# Patient Record
Sex: Female | Born: 1970 | Race: Black or African American | Hispanic: No | Marital: Married | Smoking: Never smoker
Health system: Southern US, Community
[De-identification: ages and names within clinical notes are randomized; demographics above are authoritative.]

## PROBLEM LIST (undated history)

## (undated) DIAGNOSIS — R112 Nausea with vomiting, unspecified: Secondary | ICD-10-CM

## (undated) DIAGNOSIS — I10 Essential (primary) hypertension: Secondary | ICD-10-CM

## (undated) DIAGNOSIS — R42 Dizziness and giddiness: Secondary | ICD-10-CM

## (undated) HISTORY — PX: CARPAL TUNNEL RELEASE: SHX101

## (undated) HISTORY — DX: Essential (primary) hypertension: I10

## (undated) HISTORY — PX: ABDOMINAL HYSTERECTOMY: SHX81

---

## 2003-08-28 ENCOUNTER — Ambulatory Visit (HOSPITAL_COMMUNITY): Admission: RE | Admit: 2003-08-28 | Discharge: 2003-08-28 | Payer: Self-pay | Admitting: Obstetrics & Gynecology

## 2003-09-27 ENCOUNTER — Ambulatory Visit (HOSPITAL_COMMUNITY): Admission: RE | Admit: 2003-09-27 | Discharge: 2003-09-27 | Payer: Self-pay | Admitting: *Deleted

## 2004-02-08 ENCOUNTER — Observation Stay (HOSPITAL_COMMUNITY): Admission: AD | Admit: 2004-02-08 | Discharge: 2004-02-08 | Payer: Self-pay | Admitting: Obstetrics & Gynecology

## 2004-03-19 ENCOUNTER — Inpatient Hospital Stay (HOSPITAL_COMMUNITY): Admission: RE | Admit: 2004-03-19 | Discharge: 2004-03-21 | Payer: Self-pay | Admitting: Obstetrics and Gynecology

## 2004-08-10 ENCOUNTER — Inpatient Hospital Stay (HOSPITAL_COMMUNITY): Admission: AD | Admit: 2004-08-10 | Discharge: 2004-08-12 | Payer: Self-pay | Admitting: Obstetrics and Gynecology

## 2004-08-13 ENCOUNTER — Inpatient Hospital Stay (HOSPITAL_COMMUNITY): Admission: EM | Admit: 2004-08-13 | Discharge: 2004-08-15 | Payer: Self-pay | Admitting: *Deleted

## 2004-09-17 ENCOUNTER — Inpatient Hospital Stay (HOSPITAL_COMMUNITY): Admission: RE | Admit: 2004-09-17 | Discharge: 2004-09-19 | Payer: Self-pay | Admitting: Obstetrics and Gynecology

## 2004-09-20 ENCOUNTER — Emergency Department (HOSPITAL_COMMUNITY): Admission: EM | Admit: 2004-09-20 | Discharge: 2004-09-20 | Payer: Self-pay | Admitting: Emergency Medicine

## 2004-09-20 ENCOUNTER — Inpatient Hospital Stay (HOSPITAL_COMMUNITY): Admission: RE | Admit: 2004-09-20 | Discharge: 2004-09-22 | Payer: Self-pay | Admitting: *Deleted

## 2005-04-18 ENCOUNTER — Emergency Department (HOSPITAL_COMMUNITY): Admission: EM | Admit: 2005-04-18 | Discharge: 2005-04-18 | Payer: Self-pay | Admitting: Emergency Medicine

## 2007-11-21 ENCOUNTER — Ambulatory Visit (HOSPITAL_COMMUNITY): Admission: RE | Admit: 2007-11-21 | Discharge: 2007-11-21 | Payer: Self-pay | Admitting: Family Medicine

## 2007-11-21 ENCOUNTER — Encounter (INDEPENDENT_AMBULATORY_CARE_PROVIDER_SITE_OTHER): Payer: Self-pay | Admitting: Family Medicine

## 2010-11-27 NOTE — H&P (Signed)
[name], [name] NO.:  [id number]   MEDICAL RECORD NO.:  [id number]          PATIENT TYPE:  INP   LOCATION:  A428                          FACILITY:  [facility]   PHYSICIAN:  Tilda Burrow, M.D. DATE OF BIRTH:  Aug 15, 1970   DATE OF ADMISSION:  08/10/2004  DATE OF DISCHARGE:  LH                                HISTORY & PHYSICAL   ADMISSION DIAGNOSES:  1.  Menorrhagia, recurrent.  2.  Severe anemia.  3.  Desire for elective permanent sterilization.  4.  Holosystolic 3/6 flow murmur felt secondary to anemia.   HISTORY OF PRESENT ILLNESS:  This 40 year old female gravida 4, para 2, AB-2  is now 3-1/2 months since primary cesarean section for her second child, is  admitted at this time for transfusions and hysteroscopy, D&C, endometrial  ablation accompanied by elective permanent sterilization.  [name] has been  seen in our office and evaluated for severe anemia. She had a hemoglobin of  6.3 and hematocrit 19.5 in the office on August 10, 2004.  She had been  brought to admission for transfusion as the bleeding still continues  vigorously.  She has been placed on IV Premarin 25 mg IV q.6h over night.  She is still flowing heavily. Plans are for proceeding with hysteroscopy,  D&C and endometrial ablation today as well as tubal ligation.  Her recent  pregnancy was notable for a hemoglobin of 12, hematocrit 36 during the  pregnancy, dropping only slightly to 10.5 and 31.4. A review of her old  records from prior obstetricians indicates that she had episodes of  dysfunction bleeding with menorrhagia with documented hemoglobins as low as  8.9 in 1999, requiring treatment with progesterone agents as well as t.i.d.  birth control pills with 50 mcg estrogen dosages to treat the problem then.  Evaluation this time has included a pelvic ultrasound in our office which  shows an essentially normal sized uterus, 10.9 cm in length, 5.5 cm AP  diameter, with a thickened  endometrial stripe even without any visible clots  in the fundal portion when the ultrasound was performed on August 05, 2004.  She has nonetheless been bleeding heavily at the time of that visit.  She  was not bleeding as profusely as now.  Hemoglobins have been documented in  the office at 10.8 on July 22, 2004, dropping to 6.5 shortly afterwards.  The possibility that this represents a bleeding abnormality of the old C-  section scar will be explored as we do the hysteroscopy.  Coagulation  studies show normal PT and PTT.   PAST MEDICAL HISTORY:  Positive for history of atrial fibrillation resulting  in 2-D echocardiogram performed by Dr. Pricilla Riffle at [facility]  on September 27, 2003.  This showed normal left ventricle with normal posterior  wall. The aortic valve was normal, mitral valve was normal, pulmonic valve  normal and tricuspid valve normal.  Ejection fraction was 60 to 65% of the  left ventricle. Right ventricular function was considered normal with no  effusion.  Holter monitor showed only a single episode of 3-beat-run  of wide  complex tachycardia but no actual fibrillation as her history had been  relayed to [name] (palpitations).   SURGICAL HISTORY:  Primary cesarean section in 2005 due to history of severe  shoulder dystocia with her first infant.   GYN HISTORY:  Notable for the previously mentioned recurrent episodes of  dysfunction bleeding requiring hormone manipulations with anemia as low as  8.9 in the past.   PHYSICAL EXAMINATION:  GENERAL:  Exam reveals an obviously pale African-  American female in no acute distress, tolerating the anemia surprisingly  well.  Alert and oriented x3.  NECK:  Supple.  CHEST:  Clear to auscultation.  Pulse in the 90's with 3/6 holosystolic flow  murmur over the entire precordium and also heard at cardiac apex.  ABDOMEN:  Nontender.  PELVIC:  Normal female external genitalia. Vaginal exam shows the vault full  of  generous watery blood with accompanying clots. Uterus is nontender,  anteflexed, non purulent flow. The uterus is 8-week size on bimanual exam.  Adnexae nontender without masses.   ASSESSMENT:  Menorrhagia with subsequent severe anemia and flow murmur of  anemia. Desire for elective permanent sterilization.   PLAN:  Hysteroscopy, dilation and curettage, endometrial ablation with  accompanying tubal ligation (F[place]ope-rings) on August 11, 2004).      JVF/MEDQ  D:  08/11/2004  T:  08/11/2004  Job:  [id number]

## 2010-11-27 NOTE — H&P (Signed)
NAMEGWENITH, [name]              ACCOUNT NO.:  [id number]   MEDICAL RECORD NO.:  [id number]          PATIENT TYPE:  AMB   LOCATION:  DAY                           FACILITY:  [facility]   PHYSICIAN:  Tilda Burrow, M.D. DATE OF BIRTH:  [birth year]   DATE OF ADMISSION:  09/17/2004  DATE OF DISCHARGE:  LH                                HISTORY & PHYSICAL   ADMITTING DIAGNOSES:  1.  Menorrhagia, failed to respond to endometrial ablation.  2.  Uterine fibroids, 12 weeks size.  3.  Anemia.   HISTORY OF PRESENT ILLNESS:  This 40 year old female recently status post a  hysteroscopy, D&C, endometrial ablation, August 10, 2004, has had a  unsatisfactory postoperative course.  She had a prolonged slow healing from  the endometrial ablation with dilated and the cervical canal as late as a  month after the procedure.  Hemoglobin did improve to 10.2 but unfortunately  now she has started to return to bleeding just like before.  She has been  bleeding since September 14, 2004 and is bleeding heavily.  When examined she has  perfuse fluid bloody menses without clots.  This is a distinctly abnormal  volume of blood.  Hemoglobin has dropped from 10.2 to 9.5 already.  After  discussion of options, the patient is desiring to proceed with a  hysterectomy.   PAST MEDICAL HISTORY:  History of atrial fibrillation with a 2D  echocardiogram reported normal.  Aortic valve was normal.  Ejection fraction  60-65%.  See details in old record.   SURGICAL HISTORY:  Primary cesarean section, 2005, after a vaginal delivery  complicated by severe shoulder dystocia with her first infant.   GYNECOLOGIC HISTORY:  Recurrent episodes of dysfunction bleeding requiring  hormone manipulation with hemoglobin as low as 8.9 in the past.   PHYSICAL EXAMINATION:  GENERAL:  Shows a healthy-appearing African American  female, alert, oriented x 3.  HEENT:  Pupils are equal, round and reactive.  Extraocular movements intact.  NECK:   Supple.  Trachea midline.  CHEST:  Clear to auscultation.  ABDOMEN:  Well healed Pfannenstiel incision.  EXTERNAL GENITALIA:  Normal.  PELVIC:  Vaginal exam normal secretions.  Cervix multiparous.  Heavy blood  at introitus and at cervix.  The uterus anterior, 12 weeks size.  Adnexa  nontender without appreciable masses.   IMPRESSION:  1.  Menorrhagia.  2.  Anemia.  3.  Uterine fibroids.  4.  Failed endometrial ablation.   PLAN:  Abdominal hysterectomy with removal of cervix through a Pfannenstiel  incision, September 17, 2004.      JVF/MEDQ  D:  09/16/2004  T:  09/16/2004  Job:  [id number]   cc:   Tilda Burrow, M.D.  [address] [place]  [place] [address]  Fax: [phone]

## 2010-11-27 NOTE — Op Note (Signed)
NAMEDEBERAH, ADOLF              ACCOUNT NO.:  [id number]   MEDICAL RECORD NO.:  [id number]          PATIENT TYPE:  AMB   LOCATION:  DAY                           FACILITY:  [facility]   PHYSICIAN:  Tilda Burrow, M.D. DATE OF BIRTH:  [birth year]   DATE OF PROCEDURE:  09/17/2004  DATE OF DISCHARGE:                                 OPERATIVE REPORT   PREOPERATIVE DIAGNOSES:  1.  Menorrhagia failing to respond to endometrial ablation.  2.  Uterine fibroids, 12-week size.  3.  Anemia.   POSTOPERATIVE DIAGNOSES:  1.  Menorrhagia failing to respond to endometrial ablation.  2.  Uterine fibroids, 12-week size.  3.  Anemia.   PROCEDURES:  1.  Total abdominal hysterectomy.  2.  Wide excision of cicatrix.   SURGEON:  Tilda Burrow, M.D.   ASSISTANTAsencion Noble, C.S.T.-F.A.   ANESTHESIA:  General.   COMPLICATIONS:  None.   FINDINGS:  Thinned-out lower uterine segment at site of old C-section scar.  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  Normal tubes  and ovaries bilaterally.  Evidence of old prior tubal ligation.   DETAILS OF PROCEDURE:  The patient was taken to the operating room and  prepped and draped in the usual fashion for low abdominal surgery.  A  Pfannenstiel-type incision was repeated with a 25 cm long ellipse of skin by  10 cm wide by 4 cm deep removed and then the fascia opened transversely.  The 2 cm portion of the fascia was trimmed as well to result in a  satisfactory closure result.  We then proceeded with opening the abdominal  cavity carefully with no suspicion of injury to bowel or internal organs.  Some omental adhesions to the anterior abdominal wall were identified and  taken down.  We then proceeded to identify the uterus and pack the bowel  away using three moistened laparotomy tapes, and a Balfour retractor was  placed in position.  Bladder flap in place.  The uterus was grasped with a  Lahey thyroid tenaculum, the round ligaments  taken down on either side using  0 chromic and Bovie cautery and then the utero-ovarian ligament incised,  isolated, clamped, cut, and suture ligated on either side.  The bladder flap  was developed anteriorly with some difficulty due to scarring and fibrosis.  The upper portion was developed sufficiently that the broad ligament could  be skeletonized down to the uterine vessels, curved Heaney clamp used to  crossclamp the uterine vessels, which were then ligated with 0 chromic after  transection.  The bladder flap was then carefully taken down further.  The  bladder was densely adherent to the old C-section scar and we entered from  either side and I was able to peel the bladder with its thickened,  erythematous tissue from the lower uterine segment, which was thin enough  that it actually separated into where we could see into the endocervical  canal (or lower uterine segment) through a window in the old C-section scar  site.  The bladder flap was not able to be mobilized well  at this point, and  we proceeded with taking down the upper and lower cardinal ligaments with  curved Heaney clamps and transection of uterine vessels and 0 chromic suture  ligature, followed by straight Heaney clamps across the lower cardinal  ligaments, a nice transection of the tissue and 0 chromic suture ligature.  A stab incision in the anterior cervicovaginal fornix was performed and the  cervix amputated off the vaginal cuff.  Aldridge stitches were placed at  each lateral vaginal angle and a posterior suture placed to even up the  length of the vaginal cuff and then the vaginal cuff was closed side-to-side  using a combination of interrupted and running 0 chromic sutures.  Hemostasis was quite good.  Irrigation in the pelvis was performed and  reperitonealization using interrupted 2-0 chromic sutures as allowed by the  natural inclination of the tissues.  The bladder flap was not attached down  to the  vaginal cuff, as we would prefer not to recreate the bladder  fibrosis.  The pelvis was irrigated, hemostasis confirmed, and laparotomy  equipment removed.  The anterior peritoneum was closed using continuous  running 2-0_chromic_.  The fascia was easily reapproximated with continuous  running 0 Vicryl.  The subcu fatty tissues were freed sufficiently to allow  for good tissue edge approximation, and interrupted 2-0 plain suture was  used in the subcu tissues after irrigation, resulting in good skin edge  approximation and staple closure of the skin completing the procedure.  A  flat Jackson-Pratt drain was placed just above the fascia and allowed to  exit through the incision at the right corner of the incision.  This was  sewn in with nylon suture.  The patient went to the recovery room in  excellent condition.  A 200 mL blood loss.      JVF/MEDQ  D:  09/17/2004  T:  09/17/2004  Job:  [id number]

## 2010-11-27 NOTE — Discharge Summary (Signed)
NAMEMARQUE, BANGO              ACCOUNT NO.:  [id number]   MEDICAL RECORD NO.:  [id number]          PATIENT TYPE:  INP   LOCATION:  A318                          FACILITY:  [facility]   PHYSICIAN:  Edward L. Juanetta Gosling, M.D.DATE OF BIRTH:  1970/10/26   DATE OF ADMISSION:  08/13/2004  DATE OF DISCHARGE:  02/04/2006LH                                 DISCHARGE SUMMARY   FINAL DISCHARGE DIAGNOSES:  1.  Pneumonia.  2.  Status post endometrial ablation.  3.  Anemia related to menorrhagia.  4.  History of atrial fibrillation.  5.  Hypokalemia.   HISTORY OF PRESENT ILLNESS:  [name] is a 40 year old who had an  endometrial ablation about two days prior to her admission.  She said that  she had felt a little short of breath prior to discharge but Dr. Emelda Dyer  saw her, evaluated her, she seemed to be doing okay and she was discharged  home.  She got worse through the night.  She was coughing and was congested.  She came to the emergency room.  She appeared to be in some acute  respiratory distress.  She underwent a CT scan because of concerns about  pulmonary embolism, which actually showed pneumonia, not pulmonary embolism.   PHYSICAL EXAMINATION:  GENERAL:  Showed that she was coughing constantly  during the exam nonproductively.  VITAL SIGNS:  Temperature 97.4, heart rate was 80.  Blood pressure 153/72.  Respirations 28.  She had a systolic heart murmur.  CHEST:  Showed showed rhonchi bilaterally.  Rales in the left base.   LABORATORY DATA:  A CBC showed a white count of 15,100, hemoglobin was 9.7.  B-MET showed a potassium of 3.3.  A blood gas on room air showed a pH of  7.47, PCO2 of 33, PO2 of 48.   ASSESSMENT:  She had pneumonia.   HOSPITAL COURSE:  She was started on intravenous antibiotics, given IV  fluids, and improved.  By the time of discharge her O2 saturation was 95% on  room air.  She was afebrile.   DISCHARGE MEDICATIONS:  1.  Ceftin 500 mg b.i.d.  2.  Codiclear DH  5 cc q.4h. p.r.n. cough.   FOLLOW UP:  In my office and with Dr. Emelda Dyer.  She may need to be treated  for hypertension.  There is a very strong family history of hypertension as  well but I would like to see what she does on an outpatient basis.      ELH/MEDQ  D:  08/15/2004  T:  08/15/2004  Job:  [id number]

## 2010-11-27 NOTE — Discharge Summary (Signed)
NAME:  [name], [name]                        ACCOUNT NO.:  [id number]   MEDICAL RECORD NO.:  [id number]                   PATIENT TYPE:  INP   LOCATION:  A413                                 FACILITY:  [facility]   PHYSICIAN:  Tilda Burrow, M.D.              DATE OF BIRTH:  1971-07-04   DATE OF ADMISSION:  03/19/2004  DATE OF DISCHARGE:  03/21/2004                                 DISCHARGE SUMMARY   ADMISSION DIAGNOSES:  1.  Pregnancy, [redacted] weeks gestation.  2.  History of severe shoulder dystocia.  3.  Requesting elective primary Cesarean section.   DISCHARGE DIAGNOSIS:  1.  Pregnancy, 39 weeks, delivered.  2.  Elective primary Cesarean section.   PROCEDURE:  Elective primary low transverse Cesarean section by Tilda Burrow, M.D.   DISCHARGE MEDICATIONS:  1.  Tylox 1 to 2 q.4h. p.r.n. pain.  2.  Motrin 800 mg 1 q.8h. for mild pain and cramps.   FOLLOW UP:  Follow up in 4 days.   HISTORY OF PRESENT ILLNESS:  This is a 40 year old female, gravida 4, para  1, AB 2 with 41-cm final height was admitted for primary Cesarean section  after treatment options discussed. Prior obstetric history was notable for  severe shoulder dystocia with a 7-pound, 13-ounce infant. The infant did  well but had Apgars 1, 5, and 7 initially. The patient does not desire  permanent sterilization.   HOSPITAL COURSE:  The patient underwent elective primary Cesarean section  without difficulty, delivering an 8-pound, 10-ounce female infant with a  vertex well out of the pelvis. The postoperative course was straightforward,  hemoglobin 9.1, hematocrit 26.5, white count 12,600. She is afebrile,  tolerating a regular diet, stable for discharge March 21, 2004 for  follow up in 1 week for staple removal and 4 weeks for routine check. The  patient is delighted with the outcome.     ___________________________________________                                         Tilda Burrow, M.D.   JVF/MEDQ   D:  03/26/2004  T:  03/26/2004  Job:  [id number]

## 2010-11-27 NOTE — Discharge Summary (Signed)
NAMEKAMMI, HECHLER NO.:  [id number]   MEDICAL RECORD NO.:  [id number]          PATIENT TYPE:  INP   LOCATION:  A419                          FACILITY:  [facility]   PHYSICIAN:  Tilda Burrow, M.D. DATE OF BIRTH:  1971-02-18   DATE OF ADMISSION:  09/17/2004  DATE OF DISCHARGE:  03/11/2006LH                                 DISCHARGE SUMMARY   ADMITTING DIAGNOSIS:  Menorrhagia, failed to respond to endometrial  ablation, uterine fibroids, anemia.   DISCHARGE DIAGNOSES:  1.  Menorrhagia.  2.  Uterine fibroids, 12 week size.  3.  Anemia.  4.  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   PROCEDURE:  September 17, 2004, total abdominal hysterectomy, wide excision of  cicatrix.   DETAILS OF HOSPITALIZATION:  This 40 year old female status post  hysteroscopy, D&C and endometrial ablation August 10, 2004 with prolonged  slow healing due to the endometrial ablation and dilation of the  endocervical canal as late as a month after the procedure.  Hemoglobin had  improved to 10.2, but returned to bleeding just like before when she had  required transfusions.  Hemoglobin dropped from 10.2 to 9.5 on her last  bleeding episode.   PAST MEDICAL HISTORY:  Positive for history of atrial fibrillation with  normal echocardiogram.   PAST SURGICAL HISTORY:  Cesarean section 2005 after vaginal delivery  complicated by severe shoulder dystocia, first infant.   GYN HISTORY:  Notable for multiple dysfunctional bleeding episodes with  hemoglobin as low as 8.9 in the past.  The patient had a 12-week size  uterus.  There was an old scar from her C-section that the patient requested  cosmetic improvement.   HOSPITAL COURSE:  The patient was taken to the operating room on September 17, 2004 for day surgery.  Undergoing abdominal hysterectomy as described in the  operative note.  Hysterectomy was notable for a very thinned out lower  uterine segment inside the old C-section  scar.  It was of interest that the  lower uterine segment defect opened up as we extracted the fibrotic bladder  attachments to the old C-section scar.  Pathology report showed a 150 g  uterus already opened.  Pathology report showed adenomyosis extending beyond  the area of the endometrial ablation previously performed.   Postoperative day #1 was notable for hemoglobin 9.1, hematocrit 27, down  from 10.0, 29.8 perioperatively.  She had a low-grade fever to 101 in the  morning of POD #1.  Question was atelectasis versus delayed bowel function.  She had __________ x1 on day #2.  She had passage of gas.  Hemoglobin  dropped to 8.9, 26.4 on postoperative day #2.  She was considered stable for discharge and sent home at that time by Dr.  Lisette Grinder after she was able to take lunch without throwing up.   Discharge instructions given as per routine.      JVF/MEDQ  D:  09/30/2004  T:  10/01/2004  Job:  [id number]   cc:   Lisette Grinder, MD

## 2010-11-27 NOTE — Procedures (Signed)
NAME:  ALSHA, MELAND                        ACCOUNT NO.:  [id number]   MEDICAL RECORD NO.:  [id number]                   PATIENT TYPE:  OUT   LOCATION:  RAD                                  FACILITY:  [facility]   PHYSICIAN:  Pricilla Riffle, M.D.                 DATE OF BIRTH:  06/27/1971   DATE OF PROCEDURE:  DATE OF DISCHARGE:                                  ECHOCARDIOGRAM   INDICATION:  A 40 year old with a history of atrial fibrillation.   PROCEDURE:  A 2-D echo with echo Doppler.   FINDINGS:  1. The left ventricle is normal in size with an end-diastolic dimension of     43 mm.  Interventricular septum is normal at 8 mm.  The posterior wall is     normal at 10 mm.  2. The left atrium and right atrium are normal in size.  The left atrium     measures 34 mm.  The right ventricle is normal in size.  3. The aortic valve is normal with no insufficiency.  The mitral valve is     normal with no insufficiency.  The pulmonic valve is normal with no     insufficiency.  The tricuspid valve is normal with no insufficiency.  4. Overall LV systolic junction is normal with an LVEF of approximately 60-     65%.  RV systolic function is normal.  5. No pericardial effusion is seen.      ___________________________________________                                            Pricilla Riffle, M.D.   PVR/MEDQ  D:  09/27/2003  T:  09/28/2003  Job:  [id number]   cc:   Lazaro Arms, M.D.  [address]  [place]  [place] [address]  Fax: [phone]

## 2010-11-27 NOTE — Discharge Summary (Signed)
[name], [name]              ACCOUNT NO.:  [id number]   MEDICAL RECORD NO.:  [id number]          PATIENT TYPE:  INP   LOCATION:  A428                          [facility]ACILITY:  [facility]   PHYSICIAN:  Tilda Burrow, M.D. DATE OF BIRTH:  [birth year]   DATE OF ADMISSION:  08/10/2004  DATE OF DISCHARGE:  02/01/2006LH                                 DISCHARGE SUMMARY   ADMISSION DIAGNOSES:  1.  Menorrhagia recurrent.  2.  Severe anemia.  3.  Desire for elective permanent sterilization.  4.  Holosystolic 3/6 flow murmur secondary to anemia.   DISCHARGE DIAGNOSES:  1.  Menorrhagia recurrent.  2.  Severe anemia corrected.  3.  Desire for elective permanent sterilization.   PROCEDURES:  1.  Transfusion x4 units packed cells on August 10, 2004 and August 11, 2004.  2.  Procedure on August 11, 2004 of hysterectomy, D&C, endometrial      ablation, and laparoscopic tubal sterilization with fallopian rings.   DISCHARGE MEDICATIONS:  1.  Chromagen Forte b.i.d. x30 days.  2.  Darvocet-N 100 #30 two p.o. q.4h. mild pain.  3.  Doxycycline 100 mg twice daily x5 days.   HOSPITAL SUMMARY:  This is a 40 year old female with 3-1/3 months since a  cesarean delivery of her second child, who was admitted for a hysteroscopy,  D&C, and an endometrial ablation after preoperative evaluation included a  severe hemoglobin drop to 6.3.  She was admitted for transfusions and  stabilized.  See HPI for details.   HOSPITAL COURSE:  The patient was admitted.  Her hemoglobin was reconfirmed  at 6.0.  She had continued to bleed since being seen in the office.  She had  a transfusion of three units of packed cells with the hemoglobin increasing  to 9.1 and hematocrit 26.2.  A fourth unit was given and she was taken for a  hysteroscopy, D&C, endometrial ablation, and a tubal ligation as described  in the operative note.  Postoperatively she remained very  sedated from the anesthesia until approximately 11  p.m. but was  cardiovascularly stable.  Her postoperative hemoglobin was 9.9, hematocrit  28.3, white count 13,400 with 81 neutrophils on the day of discharge.  She  was considered stable for discharge on the morning of 08-12-2004.      JVF/MEDQ  D:  08/12/2004  T:  08/12/2004  Job:  [id number]

## 2010-11-27 NOTE — Op Note (Signed)
NAME:  [name], CAIRNS                        ACCOUNT NO.:  [id number]   MEDICAL RECORD NO.:  [id number]                   PATIENT TYPE:  INP   LOCATION:  A425                                 FACILITY:  [facility]   PHYSICIAN:  Tilda Burrow, M.D.              DATE OF BIRTH:  [birth year]   DATE OF PROCEDURE:  DATE OF DISCHARGE:                                 OPERATIVE REPORT   PREOPERATIVE DIAGNOSES:  1.  Pregnancy at 39 weeks.  2.  History of prior infant with severe shoulder dystocia, larger infant.  3.  Requested elective primary Cesarean section.   POSTOPERATIVE DIAGNOSES:  1.  Pregnancy at 39 weeks.  2.  History of prior infant with severe shoulder dystocia, larger infant.  3.  Requested elective primary Cesarean section.   PROCEDURE:  Primary low transverse cervical Cesarean section.   SURGEON:  Tilda Burrow, M.D.   ASSISTANTAsencion Noble, C.S.T.-F.A.   ANESTHESIA:  Spinal by Garner Nash, C.R.N.A.   COMPLICATIONS:  Higher spinal anesthesia than average, not requiring any  additional interventions.   FINDINGS:  8 pound 10 ounce female infant, Apgars 9 and 9, presenting part out  of pelvis.   DESCRIPTION OF PROCEDURE:  The patient was taken to the operating room.  Spinal anesthesia was introduced, and the abdomen was prepped and draped.  The spinal went higher than expected, and the patient was able to breath  spontaneously but had motor and sensory loss to higher than usual level.   A Pfannenstiel-type incision was performed without difficulty.  The bladder  flap was developed from the lower uterine segment, and a transverse uterine  incision was made with the knife, identifying clear amniotic fluid, with the  incision extended laterally using index finger traction and then the fetal  vertex rotated into the incision manually and delivered using fundal  pressure by the first assistant and the vertex guided by the surgeon.  The  delivery was without complications and  delivered with cord clamped and cut  and the infant passed to the care of Dr. Francoise Schaumann. Halm the pediatrician in  attendance.  Apgars of 9 and 9 were assigned.  See Dr. Webb Laws notes for  further details on the infant.   The placenta was delivered after cord blood samples were obtained.  The  placenta appeared grossly normal without meconium discoloration and was  discarded.  Uterine irrigation was minimally done, and then the uterus was  closed with a single layer running locking closure with 0 chromic.  IV  antibiotics were administered.  The bladder flap was loosely reapproximated  with continuous running 2-0 chromic.  The anterior abdominal cavity was  irrigated with antibiotic solution.  The anterior peritoneum was closed with  2-0 chromic, and the fascia was closed with continuous running 0 Vicryl.  The subcutaneous tissues were reapproximated using 2-0 plain, and staple  closure of the skin completed the procedure.  The patient tolerated the procedure well and went to the recovery room in  good condition.      ___________________________________________                                            Tilda Burrow, M.D.   JVF/MEDQ  D:  03/19/2004  T:  03/19/2004  Job:  [id number]   cc:   Francoise Schaumann. Halm, D.O.  [address], [address]  [place]  [place] [address]  Fax: [phone]

## 2010-11-27 NOTE — H&P (Signed)
NAMEERIKO, ECONOMOS NO.:  [id number]   MEDICAL RECORD NO.:  [id number]          PATIENT TYPE:  INP   LOCATION:  A428                          FACILITY:  [facility]   PHYSICIAN:  Langley Gauss, MD     DATE OF BIRTH:  06-14-1971   DATE OF ADMISSION:  09/20/2004  DATE OF DISCHARGE:  LH                                HISTORY & PHYSICAL   This is a 40 year old patient who recently underwent uncomplicated TAH and  panniculectomy by Dr. Christin Bach.  The operative procedure was performed  on September 17, 2004.  Her postoperative course was complicated only by findings  of hypoactive bowel sounds on postoperative day #1.  However, on  postoperative day #2, the patient was fully ambulatory, passing gas, having  regular general diet.  Thus was discharged to home on September 19, 2004.   The patient states that following discharge, she did well.  She went to bed  at 8 p.m. on September 19, 2004, and then awoke at 0500 on September 20, 2004, with  vomiting of green-looking fluid.  She had 7 to 8 episodes of vomiting during  the day.  In addition, she describes about 7 or 8 episodes of diarrhea.  She  states that she was seen at [facility] Emergency Room a.m. of September 20, 2004.  She was evaluated by the emergency room physician only.  I was not  contacted regarding her presentation. She was told that she had a 24-hour  viral bug and was discharged to home.  The patient subsequently continued  throughout the day to be unable to tolerate any p.o. or solid intake,  continued to have the vomiting and then significant weakness.   PAST MEDICAL HISTORY AND PAST SOCIAL HISTORY:  No change from previous  admission.  Old records are requested which will include the H&P from the  hysterectomy performed September 17, 2004.   PHYSICAL EXAMINATION:  GENERAL:  Slightly obese, very sleepy female at  present.  She is asking for liquids at this time.  VITAL SIGNS:  Temperature 98.0, pulse 76, respiratory rate  20, blood  pressure 121/52.  HEENT:  Mucous membranes are moist.  ABDOMEN: Soft, appropriate postoperative tenderness is appreciated, but no  peritoneal signs are appreciated.  The abdomen is nondistended.  There are  some hypoactive bowel sounds present throughout the lower pelvic and  abdominal area.  PELVIC:  She denies any vaginal bleeding or significant pressure in the  suprapubic area.  Thus, pelvic examination is not performed.   LABORATORY DATA AND OTHER STUDIES:  CBC and comprehensive panel are  requested, and there they are trying to relocate any labs were done in the  emergency room dated a.m. September 20, 2004.  Laboratories obtained revealed  hemoglobin 10.5, hematocrit 31.3, with a white count of 8.6.  Liver function  tests within normal limits.  Electrolytes not available.   ASSESSMENT:  The patient had uncomplicated hysterectomy performed  abdominally with hospitalization March 9 through March 11.  The patient did  well during the first 16 hours following discharge from this hospitalization  and then had the acute onset of episodes of diarrhea with green vomiting  material.  At present she does not appear to be significantly nauseated  after receiving IV fluids and single injection of Phenergan.  She is asking  for liquids at this time.   IMPRESSION:  Probable postoperative physiological ileus.  A flat plate and  upright of the abdomen is requested to look for any free fluid within the  abdomen.   PLAN:  Initially was to place the patient at complete bowel rest.  But now  with her request for liquids, she is placed on a clear liquid diet.  Additional diagnostic studies to be ordered and requested upon review of  patient's clinical course during this hospitalization.      DC/MEDQ  D:  09/20/2004  T:  09/20/2004  Job:  [id number]   cc:   [facility]

## 2010-11-27 NOTE — H&P (Signed)
NAME:  [name], [name].                       ACCOUNT NO.:  [id number]   MEDICAL RECORD NO.:  [id number]                   PATIENT TYPE:   LOCATION:                                       FACILITY:   PHYSICIAN:  Tilda Burrow, M.D.              DATE OF BIRTH:  October 16, 1970   DATE OF ADMISSION:  DATE OF DISCHARGE:                                HISTORY & PHYSICAL   ADMITTING DIAGNOSES:  1.  Pregnancy, 39-weeks gestation.  2.  History of severe shoulder dystocia, requesting elective primary      cesarean section.   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   HISTORY OF PRESENT ILLNESS:  This 39 year old female, gravida 4, para 1, AB  2, LMP June 19, 2003, placing [facility] March 25, 2004, is admitted for  elective primary cesarean section.  The patient's prenatal course has been  notable for 13 prenatal visits, with fundal height measuring 41 cm at last  visit, with presenting part remaining ballotable at 38 weeks, 6 days.  The  patient's pregnancy course is notable for slightly greater than dates,  throughout the third trimester, despite normal one-hour glucose tolerance  test.  Blood type is O positive.  Urine drug screen is negative.  Rubella  immunity is present.  Hemoglobin 12, hematocrit 36.  Hepatitis, HIV  negative. The patient has a positive HSV-2 antibody titer.  RPR is  nonreactive.  Pap smear class 1.  MSA-FP normal at 16 weeks with glucose  tolerance 88 mg percent, at the time of 28 weeks.  The patient plans to  bottle feed, plans future contraception with birth control pills.   The purpose for cesarean section delivery is Ms. Fountain's preference after  discussion of options.  Prior pregnancy record from 1994 indicates that the  patient had a vacuum-assisted vaginal delivery after 50 minutes second stage  and an 8-hour labor.  The infant was 7 pounds, 13 ounces, but delivery was  complicated by an extremely-difficulty shoulder dystocia which required  extensive manipulations, and despite a deep episiotomy, had Apgars of 1, 5  and 7.  There were no sequelae noted, and no evidence of palsy.  The patient  was therefore given a discussion of options.  Her options included induction  at cervical favorability.  The risk of recurrent dystocia were discussed,  and the unpredictable nature of dystocia reviewed. The patient has  absolutely no interest in running that risk and requests primary cesarean  section which will be performed on March 19, 2004.   PHYSICAL EXAMINATION:  VITAL SIGNS:  Height 5 feet, 5 inches.  Weight 192  which is a 14 pound weight gain.  Blood pressure 122/72, pulse 90.  ABDOMEN:  Shows 41 cm fundal height, vertex presentation, well elevated  cervix, 1 to 2 cm long, high, -3, vertex, ballotable over a soft cervix.   PLAN:  A primary cesarean section March 19, 2004.  ___________________________________________                                         Tilda Burrow, M.D.   JVF/MEDQ  D:  03/17/2004  T:  03/17/2004  Job:  [id number]   cc:   Francoise Schaumann. Halm, D.O.  [address]  [place]  [place] [address]  Fax: [phone]   Labor and Delivery  Fax [phone]   [facility]

## 2010-11-27 NOTE — Discharge Summary (Signed)
[name], [name]              ACCOUNT NO.:  [id number]   MEDICAL RECORD NO.:  [id number]          PATIENT TYPE:  INP   LOCATION:  A428                          FACILITY:  [facility]   PHYSICIAN:  Tilda Burrow, M.D. DATE OF BIRTH:  Nov 11, 1970   DATE OF ADMISSION:  09/20/2004  DATE OF DISCHARGE:  03/14/2006LH                                 DISCHARGE SUMMARY   ADMISSION DIAGNOSIS:  Postoperative ileus.   DISCHARGE DIAGNOSIS:  Postoperative ileus, resolved.   DISCHARGE MEDICATIONS:  1.  Cipro 500 mg b.i.d. times seven days.  2.  Tylox one q.4h. p.r.n. pain, unchanged from admission.   HOSPITAL SUMMARY:  This 40 year old female underwent an apparently an  uncomplicated TAH and panniculectomy on Thursday, September 17, 2004, and  discharged September 19, 2004. She was seen in the emergency room for vomiting  of bilious-appearing fluid on the morning of September 20, 2004, and discharged  home with a diagnosis of gastroenteritis. I spoke with covering physician,  Dr. Roylene Reason. Lisette Grinder, that afternoon and due to continued inability to take  any p.o. or solid intake and then developed weakness. She was admitted to  the hospital with white count of 8600, normal differential, hemoglobin 10.5,  hematocrit 31.3 with normal chest x-ray and normal three-way abdomen without  evidence of free air. Normal bowel gas pattern was present. She had a  temperature of 98.0, pulse 76, respirations 20. Due to the postoperative  course and slow response to fluid hydration she was admitted for monitoring.   HOSPITAL COURSE:  The patient was admitted, remained afebrile. Had  relatively hypoactive bowel sounds even on the morning of September 21, 2004.  She had no distention and abdominal discomfort was considered within normal  limits. She was kept an additional 24 hours at which time she was passing  gas. She had active bowel sounds. She did not have a bowel movement, but she  had had a bowel prep prior to surgery.  Incision was clean, half the staples  were removed, and JP drain was removed. She was discharged home on prior  medication regimen for follow up in our office for six days for removal of  the remaining staples with the usual postoperative instructions including  monitoring temperature, pain management, and follow up will be in six days  in our office as previously mentioned.      JVF/MEDQ  D:  09/22/2004  T:  09/22/2004  Job:  [id number]

## 2010-11-27 NOTE — Group Therapy Note (Signed)
[name]              ACCOUNT NO.:  [id number]   MEDICAL RECORD NO.:  [id number]          PATIENT TYPE:  INP   LOCATION:  A318                          FACILITY:  [facility]   PHYSICIAN:  Edward L. Juanetta Gosling, M.D.DATE OF BIRTH:  05/20/1971   DATE OF PROCEDURE:  08/14/2004  DATE OF DISCHARGE:  08/15/2004                                   PROGRESS NOTE   DATE OF VISIT:  August 14, 2004   SUBJECTIVE:  [name] says that she feels better and wants to go home.  However, she is still coughing a lot and her O2 saturation is still not  adequate. I have told her that I do not think she is quite ready.   PHYSICAL EXAMINATION:  VITAL SIGNS:  Temperature 99.7, pulse 81, respiratory  rate 18, blood pressure 137/84. O2 saturation 83% on 3 liters.  CHEST:  Clear.  HEART:  Regular.   ASSESSMENT:  She has pneumonia. She is not ready for discharge.   DATE OF VISIT:  August 16, 2003   SUBJECTIVE:  [name] says that she feels much better. She is not  coughing as much. She has had her oxygen off for about 2 hours. She is  breathing well.   PHYSICAL EXAMINATION:  VITAL SIGNS:  Temperature 98.5, pulse 72, respiratory  rate 20, blood pressure 146/101. O2 saturation is 95% on no oxygen.  CHEST:  Is much clearer. She still has some rhonchi.  HEART:  Regular.   ASSESSMENT:  She is much improved.   PLAN:  Discharge home today. Please see discharge summary for details.      ELH/MEDQ  D:  08/15/2004  T:  08/16/2004  Job:  [id number]

## 2010-11-27 NOTE — Op Note (Signed)
[name], [name] NO.:  [id number]   MEDICAL RECORD NO.:  [id number]          PATIENT TYPE:  INP   LOCATION:                                FACILITY:  [facility]   PHYSICIAN:  Tilda Burrow, M.D. DATE OF BIRTH:  Jun 26, 1971   DATE OF PROCEDURE:  08/10/2004  DATE OF DISCHARGE:  08/12/2004                                 OPERATIVE REPORT   PREOPERATIVE DIAGNOSIS:  Menorrhagia, recurrent severe anemia, desire for  elective permanent sterilization.   POSTOPERATIVE DIAGNOSIS:  Menorrhagia, recurrent severe anemia, desire for  elective permanent sterilization.   OPERATION PERFORMED:  Hysteroscopy, dilation and curettage, endometrial  ablation.  Elective permanent sterilization by Fallope ring application.   SURGEON:  Tilda Burrow, M.D.   ASSISTANT:  1.  Grogan, CST.  2.  Katrinka Blazing, RN.   ANESTHESIA:  General.   COMPLICATIONS:  None.   FINDINGS:  Uterus sounding to 12 cm preprocedure, diffusely enlarged uterus  when visualized laparoscopically.  Normal tubes and ovaries bilaterally.  Anterior omental adhesions to abdominal wall at the site of old cesarean  section scar, left alone, uncorrected.   DESCRIPTION OF PROCEDURE:  The patient was taken to the operating room and  prepped and draped for combined abdominal and vaginal procedure.  Speculum  was inserted, cervix grasped with single toothed tenaculum and the uterus  sounded to 12 cm in the anteflexed position after in and out catheterization  of the bladder. The hysteroscopy was easily performed. The lower uterine  segment was already dilated sufficiently to easily pass a 27 [place] dilator.  Hysterectomy revealed blood clots and shaggy endometrium.  There were  no  polyps and no fibroid abnormalities noted.  The hysteroscope was removed and  curettaged performed.  Curettage in all four quadrants with smooth and sharp  curet resulted in extremely generous amount of blood loss estimated at least  200 mL  along with removal of small amount of tissue fragments.  Repeat  hysteroscopy confirmed a very smooth uniform contour of the uterine cavity  after we irrigated out the clots.  The patient was considered candidate for  endometrial ablation.  The [employer] Thermachoice endometrial ablation  technique was used in standard fashion.  Single toothed tenaculum was used  to grasp the anterior cervix and the ablation device inserted to the full  depth, instilled with saline, attempting to reach the 150 mmHg pressure  recommended.  The uterus was soft and boggy and continued to relax and after  30 plus mL were placed, we attempted to use IV oxytocin at 20 units in a  liter of fluid, which resulted in increasing the uterine tone from 120 mmHg  to 145 mmHg, thus allowing [name] to proceed with the endometrial ablation which  was then performed.  8 minute heat cycle was performed with apparent  success.  Fluid was aspirated and all 35 mL was returned.  The endocervical  canal had a slight blanched appearance.  It is thought that the easily soft  dilated lower uterine segment had probably allowed the balloon to spill into  the lower uterine  segment and internal os.  This was not considered a draw  back to the procedure.  We then placed a Hulka tenaculum on the uterus for  manipulation and proceeded with tubal sterilization.   Tubal ligation was performed by moving to the abdomen, performing an  infraumbilical vertical skin incision as well as a transverse suprapubic  incision in the old cesarean section scar, using Veress needle to achieve  pneumoperitoneum easily, using water droplet technique to identify the  intraperitoneal positioning of the needle, with easy insufflation under 14  mmHg water pressure.  A 5 mm laparoscopic trocar was introduced carefully  under direct camera direction and photos taken of the pelvis including the  omental thin layer of central layer of omental adhesions to the anterior   abdominal wall which did not interfere with the surgical procedure planned.  The uterus was large and boggy in appearance, estimated to be 10 to 12 weeks  size.  The possibility of adenomyosis was considered.   We proceeded with placement of Fallope rings on the tubes at this point. The  first firing on the left tube resulted in placement of two rings on the left  tube.  This was visually noted and both were in proper position.  The right  tube a single ring was placed.  Photodocumentation was confirmed.  There was  no evidence of intra-abdominal bleeding or trauma suspected, 200 mL of  saline were instilled into the abdomen.  Deflation of the abdomen performed.  Laparoscopic equipment removed and subcuticular 4-0 Dexon closure of each  skin incision performed.  Staples were not necessary.  Patient went to  recovery room in good condition.      JVF/MEDQ  D:  08/12/2004  T:  08/12/2004  Job:  [id number]

## 2010-11-27 NOTE — H&P (Signed)
[name], [name]              ACCOUNT NO.:  [id number]   MEDICAL RECORD NO.:  [id number]          PATIENT TYPE:  INP   LOCATION:  A318                          FACILITY:  [facility]   PHYSICIAN:  Edward L. Juanetta Gosling, M.D.DATE OF BIRTH:  1970/09/01   DATE OF ADMISSION:  08/13/2004  DATE OF DISCHARGE:  LH                                HISTORY & PHYSICAL   REASON FOR ADMISSION:  Pneumonia.   HISTORY:  [name] is a 40 year old who had an endometrial ablation, I  believe two days ago.  She said that she had been doing fairly well, was a  little short of breath before she left the hospital, but apparently had been  checked by Dr. Emelda Fear, discharged, but got worse through the night,  coughing and congested, and came to the emergency room, where there was  concern because of her history that she had a pulmonary embolism, and she  underwent CT scanning, which actually shows pneumonia, not embolism.  She  says that she has not had fever or chills, she has not had any other  complaints.  She has been coughing up some clear sputum.  When she was  hospitalized for this procedure, she ended up having to have four units of  packed red blood cells.  On January 30, she had a hemoglobin of 6.3,  hematocrit of 19.5.  She also had a tubal ligation while she was  hospitalized.   PAST MEDICAL HISTORY:  Positive for atrial fibrillation, which the patient  had an echocardiogram in March 2005 which showed normal left ventricle,  normal posterior wall, aortic valve was normal, mitral valve normal,  pulmonic valve normal, ejection fraction 50-65%.  Right ventricular function  was normal.  She did have a Holter with no episodes of atrial fibrillation.   PAST SURGICAL HISTORY:  Positive for a C-section.   FAMILY HISTORY:  Positive for hypertension and stroke.   PHYSICAL EXAMINATION:  GENERAL:  She is coughing pretty much consistently  through the exam, nonproductively.  VITAL SIGNS:  Her temperature is  97.4, heart rate is 80, blood pressure  153/72, respirations are 28.  HEENT:  Her mucous membranes are slightly dry.  CHEST:  Some rhonchi bilaterally, some rales in the left base.  CARDIAC:  Her heart is regular.  She does have a systolic murmur.  ABDOMEN:  Soft.  EXTREMITIES:  No edema.   LABORATORY DATA:  CBC shows white count 15,100, hemoglobin is 9.7.  BMET  shows her potassium is 3.3.  Blood gas on room air, pH 7.47, PCO2 33, PAO2  48.  D-dimer was 2.95.  BNP 154.   ASSESSMENT:  She has what appears to be a pneumonia.   PLAN:  Admit her for antibiotic treatment.  Will give her low-dose Lovenox,  continue her other treatments and follow.      ELH/MEDQ  D:  08/13/2004  T:  08/13/2004  Job:  [id number]   cc:   Tilda Burrow, M.D.  [address] [place]  [place] [address]  Fax: [phone]

## 2016-10-04 ENCOUNTER — Ambulatory Visit: Payer: Self-pay | Admitting: Orthopedic Surgery

## 2017-01-21 ENCOUNTER — Emergency Department (HOSPITAL_COMMUNITY): Payer: BLUE CROSS/BLUE SHIELD

## 2017-01-21 ENCOUNTER — Emergency Department (HOSPITAL_COMMUNITY)
Admission: EM | Admit: 2017-01-21 | Discharge: 2017-01-21 | Disposition: A | Discharge status: Home / Self Care | Payer: BLUE CROSS/BLUE SHIELD | Attending: Emergency Medicine | Admitting: Emergency Medicine

## 2017-01-21 ENCOUNTER — Encounter (HOSPITAL_COMMUNITY): Payer: Self-pay | Admitting: Emergency Medicine

## 2017-01-21 DIAGNOSIS — R42 Dizziness and giddiness: Secondary | ICD-10-CM | POA: Insufficient documentation

## 2017-01-21 DIAGNOSIS — M6281 Muscle weakness (generalized): Secondary | ICD-10-CM | POA: Diagnosis present

## 2017-01-21 HISTORY — DX: Dizziness and giddiness: R42

## 2017-01-21 LAB — CBC WITH DIFFERENTIAL/PLATELET
Basophils Absolute: 0 10*3/uL (ref 0.0–0.1)
Basophils Relative: 0 %
Eosinophils Absolute: 0.2 10*3/uL (ref 0.0–0.7)
Eosinophils Relative: 2 %
HEMATOCRIT: 38.1 % (ref 36.0–46.0)
HEMOGLOBIN: 12.8 g/dL (ref 12.0–15.0)
LYMPHS ABS: 3.4 10*3/uL (ref 0.7–4.0)
LYMPHS PCT: 26 %
MCH: 27.9 pg (ref 26.0–34.0)
MCHC: 33.6 g/dL (ref 30.0–36.0)
MCV: 83.2 fL (ref 78.0–100.0)
MONO ABS: 0.5 10*3/uL (ref 0.1–1.0)
MONOS PCT: 4 %
NEUTROS ABS: 8.9 10*3/uL — AB (ref 1.7–7.7)
Neutrophils Relative %: 68 %
Platelets: 279 10*3/uL (ref 150–400)
RBC: 4.58 MIL/uL (ref 3.87–5.11)
RDW: 13.7 % (ref 11.5–15.5)
WBC: 13 10*3/uL — ABNORMAL HIGH (ref 4.0–10.5)

## 2017-01-21 LAB — URINALYSIS, ROUTINE W REFLEX MICROSCOPIC
BILIRUBIN URINE: NEGATIVE
Glucose, UA: NEGATIVE mg/dL
HGB URINE DIPSTICK: NEGATIVE
KETONES UR: 5 mg/dL — AB
Leukocytes, UA: NEGATIVE
NITRITE: NEGATIVE
PH: 7 (ref 5.0–8.0)
Protein, ur: NEGATIVE mg/dL
SPECIFIC GRAVITY, URINE: 1.004 — AB (ref 1.005–1.030)

## 2017-01-21 LAB — BRAIN NATRIURETIC PEPTIDE: B Natriuretic Peptide: 67 pg/mL (ref 0.0–100.0)

## 2017-01-21 LAB — COMPREHENSIVE METABOLIC PANEL
ALK PHOS: 81 U/L (ref 38–126)
ALT: 32 U/L (ref 14–54)
ANION GAP: 9 (ref 5–15)
AST: 27 U/L (ref 15–41)
Albumin: 3.8 g/dL (ref 3.5–5.0)
BILIRUBIN TOTAL: 0.6 mg/dL (ref 0.3–1.2)
BUN: 6 mg/dL (ref 6–20)
CALCIUM: 9.2 mg/dL (ref 8.9–10.3)
CO2: 25 mmol/L (ref 22–32)
Chloride: 103 mmol/L (ref 101–111)
Creatinine, Ser: 0.88 mg/dL (ref 0.44–1.00)
GFR calc Af Amer: >60 mL/min (ref >60)
Glucose, Bld: 100 mg/dL — ABNORMAL HIGH (ref 65–99)
POTASSIUM: 3.2 mmol/L — AB (ref 3.5–5.1)
Sodium: 137 mmol/L (ref 135–145)
TOTAL PROTEIN: 7.7 g/dL (ref 6.5–8.1)

## 2017-01-21 LAB — TROPONIN I

## 2017-01-21 MED ORDER — SODIUM CHLORIDE 0.9 % IV BOLUS (SEPSIS)
500.0000 mL | Freq: Once | INTRAVENOUS | Status: AC
  Administered 2017-01-21: 500 mL via INTRAVENOUS

## 2017-01-21 MED ORDER — PROMETHAZINE HCL 25 MG/ML IJ SOLN
12.5000 mg | Freq: Once | INTRAMUSCULAR | Status: AC
Start: 2017-01-21 — End: 2017-01-21
  Administered 2017-01-21: 12.5 mg via INTRAVENOUS
  Filled 2017-01-21: qty 1

## 2017-01-21 MED ORDER — ONDANSETRON 4 MG PO TBDP: ORAL_TABLET | ORAL | 0 refills | Status: DC

## 2017-01-21 MED ORDER — MECLIZINE HCL 25 MG PO TABS
25.0000 mg | ORAL_TABLET | Freq: Three times a day (TID) | ORAL | 0 refills | Status: DC | PRN
Start: 2017-01-21 — End: 2018-05-21

## 2017-01-21 MED ORDER — MECLIZINE HCL 12.5 MG PO TABS
25.0000 mg | ORAL_TABLET | Freq: Once | ORAL | Status: AC
  Administered 2017-01-21: 25 mg via ORAL
  Filled 2017-01-21: qty 2

## 2017-01-21 MED ORDER — SODIUM CHLORIDE 0.9 % IV BOLUS (SEPSIS)
1000.0000 mL | Freq: Once | INTRAVENOUS | Status: AC
  Administered 2017-01-21: 1000 mL via INTRAVENOUS

## 2017-01-21 NOTE — ED Notes (Signed)
ED Provider at bedside. 

## 2017-01-21 NOTE — ED Notes (Signed)
Pt helped to the bathroom to void.  Pt was able to ambulate

## 2017-01-21 NOTE — ED Triage Notes (Addendum)
Patient complaining of generalized weakness and numbness starting at 1230 today. Also states "It feels like my hearts been beating fast." Denies chest pain. Patient has equal grips bilaterally. No facial droop at this time.

## 2017-01-21 NOTE — ED Provider Notes (Signed)
[facility] DEPT Provider Note   CSN: [id number] Arrival date & time: 01/21/17  1625     History   Chief Complaint Chief Complaint  Patient presents with  . Weakness    HPI [name] is a 46 y.o. female.  Patient complains of dizziness and weakness that came on all of a sudden. Patient has a history of vertigo before   The history is provided by the patient. No language interpreter was used.  Weakness  Primary symptoms include dizziness.  Primary symptoms include no focal weakness. This is a recurrent problem. The current episode started 1 to 2 hours ago. The problem has been gradually improving. There was no focality noted. There has been no fever. Pertinent negatives include no shortness of breath, no chest pain and no headaches. There were no medications administered prior to arrival. Associated medical issues include trauma.    Past Medical History:  Diagnosis Date  . Vertigo     There are no active problems to display for this patient.   Past Surgical History:  Procedure Laterality Date  . ABDOMINAL HYSTERECTOMY    . CESAREAN SECTION      OB History    No data available       Home Medications    Prior to Admission medications   Medication Sig Start Date End Date Taking? Authorizing Provider  meclizine (ANTIVERT) 25 MG tablet Take 1 tablet (25 mg total) by mouth 3 (three) times daily as needed for dizziness. 01/21/17   Bethann BerkshireZammit, Ronith Berti, MD  ondansetron (ZOFRAN ODT) 4 MG disintegrating tablet 4mg  ODT q4 hours prn nausea/vomit 01/21/17   Bethann BerkshireZammit, Rubby Barbary, MD    Family History History reviewed. No pertinent family history.  Social History Social History  Substance Use Topics  . Smoking status: Never Smoker  . Smokeless tobacco: Never Used  . Alcohol use No     Allergies   Patient has no known allergies.   Review of Systems Review of Systems  Constitutional: Negative for appetite change and fatigue.  HENT: Negative for congestion, ear  discharge and sinus pressure.   Eyes: Negative for discharge.  Respiratory: Negative for cough and shortness of breath.   Cardiovascular: Negative for chest pain.  Gastrointestinal: Negative for abdominal pain and diarrhea.  Genitourinary: Negative for frequency and hematuria.  Musculoskeletal: Negative for back pain.  Skin: Negative for rash.  Neurological: Positive for dizziness and weakness. Negative for focal weakness, seizures and headaches.  Psychiatric/Behavioral: Negative for hallucinations.     Physical Exam Updated Vital Signs BP 137/85   Pulse 63   Temp 98.5 F (36.9 C) (Oral)   Resp (!) 24   Ht 5\' 3"  (1.6 m)   Wt 96.2 kg (212 lb)   SpO2 97%   BMI 37.55 kg/m   Physical Exam  Constitutional: She is oriented to person, place, and time. She appears well-developed.  HENT:  Head: Normocephalic.  Eyes: Conjunctivae and EOM are normal. No scleral icterus.  Neck: Neck supple. No thyromegaly present.  Cardiovascular: Normal rate and regular rhythm.  Exam reveals no gallop and no friction rub.   No murmur heard. Pulmonary/Chest: No stridor. She has no wheezes. She has no rales. She exhibits no tenderness.  Abdominal: She exhibits no distension. There is no tenderness. There is no rebound.  Musculoskeletal: Normal range of motion. She exhibits no edema.  Lymphadenopathy:    She has no cervical adenopathy.  Neurological: She is oriented to person, place, and time. She exhibits normal muscle tone.  Coordination normal.  Patient with mildly unsteady on feet and she felt like the room is spinning when she was standing  Skin: No rash noted. No erythema.  Psychiatric: She has a normal mood and affect. Her behavior is normal.     ED Treatments / Results  Labs (all labs ordered are listed, but only abnormal results are displayed) Labs Reviewed  CBC WITH DIFFERENTIAL/PLATELET - Abnormal; Notable for the following:       Result Value   WBC 13.0 (*)    Neutro Abs 8.9 (*)     All other components within normal limits  COMPREHENSIVE METABOLIC PANEL - Abnormal; Notable for the following:    Potassium 3.2 (*)    Glucose, Bld 100 (*)    All other components within normal limits  URINALYSIS, ROUTINE W REFLEX MICROSCOPIC - Abnormal; Notable for the following:    Color, Urine STRAW (*)    Specific Gravity, Urine 1.004 (*)    Ketones, ur 5 (*)    All other components within normal limits  TROPONIN I  BRAIN NATRIURETIC PEPTIDE    EKG  EKG Interpretation  Date/Time:  Friday January 21 2017 16:42:39 EDT Ventricular Rate:  85 PR Interval:    QRS Duration: 87 QT Interval:  379 QTC Calculation: 451 R Axis:   67 Text Interpretation:  Sinus rhythm Abnormal R-wave progression, early transition Baseline wander in lead(s) I III aVL Confirmed by Bethann Berkshire [phone]) on 01/21/2017 8:37:43 PM       Radiology Dg Chest 2 View  Result Date: 01/21/2017 CLINICAL DATA:  Generalized weakness, numbness, chest pain, shortness of breath EXAM: CHEST  2 VIEW COMPARISON:  08/13/2004 FINDINGS: Cardiomegaly with vascular congestion. No confluent opacities, effusions or edema. No acute bony abnormality. IMPRESSION: Cardiomegaly, vascular congestion. Electronically Signed   By: Charlett Nose M.D.   On: 01/21/2017 17:36   Ct Head Wo Contrast  Result Date: 01/21/2017 CLINICAL DATA:  Generalized weakness, numbness EXAM: CT HEAD WITHOUT CONTRAST TECHNIQUE: Contiguous axial images were obtained from the base of the skull through the vertex without intravenous contrast. COMPARISON:  None. FINDINGS: Brain: No acute intracranial abnormality. Specifically, no hemorrhage, hydrocephalus, mass lesion, acute infarction, or significant intracranial injury. Vascular: No hyperdense vessel or unexpected calcification. Skull: No acute calvarial abnormality. Sinuses/Orbits: Mucosal thickening throughout the paranasal sinuses. Mastoid air cells are clear. Orbital soft tissues unremarkable. Other: None IMPRESSION:  No acute intracranial abnormality. Chronic sinusitis. Electronically Signed   By: Charlett Nose M.D.   On: 01/21/2017 18:00    Procedures Procedures (including critical care time)  Medications Ordered in ED Medications  meclizine (ANTIVERT) tablet 25 mg (25 mg Oral Given 01/21/17 1701)  sodium chloride 0.9 % bolus 500 mL (0 mLs Intravenous Stopped 01/21/17 1754)  promethazine (PHENERGAN) injection 12.5 mg (12.5 mg Intravenous Given 01/21/17 1705)  sodium chloride 0.9 % bolus 1,000 mL (0 mLs Intravenous Stopped 01/21/17 1900)  meclizine (ANTIVERT) tablet 25 mg (25 mg Oral Given 01/21/17 2027)     Initial Impression / Assessment and Plan / ED Course  I have reviewed the triage vital signs and the nursing notes.  Pertinent labs & imaging results that were available during my care of the patient were reviewed by me and considered in my medical decision making (see chart for details).     Patient with vertigo. Patient improved with Antivert. Labs and CT scan unremarkable. She'll be sent home with some Antivert and Zofran. will follow-up next week if needed  Final Clinical Impressions(s) /  ED Diagnoses   Final diagnoses:  Vertigo    New Prescriptions New Prescriptions   MECLIZINE (ANTIVERT) 25 MG TABLET    Take 1 tablet (25 mg total) by mouth 3 (three) times daily as needed for dizziness.   ONDANSETRON (ZOFRAN ODT) 4 MG DISINTEGRATING TABLET    4mg  ODT q4 hours prn nausea/vomit     Bethann Berkshire, MD 01/21/17 2051

## 2017-01-21 NOTE — Discharge Instructions (Signed)
Rest at home 1-2 days.  Return if any problems

## 2017-01-21 NOTE — ED Notes (Signed)
Gave patient a gingerale and graham crackers as per Dr. Estell HarpinZammit

## 2017-01-21 NOTE — ED Notes (Signed)
Patient transported to CT 

## 2018-05-21 ENCOUNTER — Other Ambulatory Visit: Payer: Self-pay

## 2018-05-21 ENCOUNTER — Encounter (HOSPITAL_COMMUNITY): Payer: Self-pay | Admitting: Emergency Medicine

## 2018-05-21 ENCOUNTER — Observation Stay (HOSPITAL_COMMUNITY)
Admission: EM | Admit: 2018-05-21 | Discharge: 2018-05-22 | Disposition: A | Discharge status: Home / Self Care | Payer: BLUE CROSS/BLUE SHIELD | Attending: General Surgery | Admitting: General Surgery

## 2018-05-21 ENCOUNTER — Emergency Department (HOSPITAL_COMMUNITY): Payer: BLUE CROSS/BLUE SHIELD

## 2018-05-21 ENCOUNTER — Observation Stay (HOSPITAL_COMMUNITY): Payer: BLUE CROSS/BLUE SHIELD

## 2018-05-21 DIAGNOSIS — R740 Nonspecific elevation of levels of transaminase and lactic acid dehydrogenase [LDH]: Secondary | ICD-10-CM | POA: Diagnosis not present

## 2018-05-21 DIAGNOSIS — R1011 Right upper quadrant pain: Secondary | ICD-10-CM | POA: Diagnosis not present

## 2018-05-21 DIAGNOSIS — K8050 Calculus of bile duct without cholangitis or cholecystitis without obstruction: Secondary | ICD-10-CM | POA: Insufficient documentation

## 2018-05-21 DIAGNOSIS — R112 Nausea with vomiting, unspecified: Secondary | ICD-10-CM | POA: Diagnosis not present

## 2018-05-21 DIAGNOSIS — R7401 Elevation of levels of liver transaminase levels: Secondary | ICD-10-CM

## 2018-05-21 DIAGNOSIS — Z01818 Encounter for other preprocedural examination: Secondary | ICD-10-CM

## 2018-05-21 DIAGNOSIS — R1010 Upper abdominal pain, unspecified: Secondary | ICD-10-CM | POA: Diagnosis not present

## 2018-05-21 DIAGNOSIS — K8020 Calculus of gallbladder without cholecystitis without obstruction: Secondary | ICD-10-CM | POA: Insufficient documentation

## 2018-05-21 LAB — COMPREHENSIVE METABOLIC PANEL
ALBUMIN: 4 g/dL (ref 3.5–5.0)
ALK PHOS: 112 U/L (ref 38–126)
ALT: 225 U/L — AB (ref 0–44)
ANION GAP: 7 (ref 5–15)
AST: 440 U/L — ABNORMAL HIGH (ref 15–41)
BUN: 9 mg/dL (ref 6–20)
CALCIUM: 8.9 mg/dL (ref 8.9–10.3)
CHLORIDE: 105 mmol/L (ref 98–111)
CO2: 26 mmol/L (ref 22–32)
CREATININE: 0.77 mg/dL (ref 0.44–1.00)
GFR calc Af Amer: >60 mL/min (ref >60)
GFR calc non Af Amer: >60 mL/min (ref >60)
GLUCOSE: 119 mg/dL — AB (ref 70–99)
Potassium: 3.5 mmol/L (ref 3.5–5.1)
SODIUM: 138 mmol/L (ref 135–145)
Total Bilirubin: 1 mg/dL (ref 0.3–1.2)
Total Protein: 7.8 g/dL (ref 6.5–8.1)

## 2018-05-21 LAB — CBC WITH DIFFERENTIAL/PLATELET
ABS IMMATURE GRANULOCYTES: 0.01 10*3/uL (ref 0.00–0.07)
BASOS ABS: 0 10*3/uL (ref 0.0–0.1)
BASOS PCT: 0 %
Eosinophils Absolute: 0.3 10*3/uL (ref 0.0–0.5)
Eosinophils Relative: 3 %
HCT: 41.4 % (ref 36.0–46.0)
HEMOGLOBIN: 12.9 g/dL (ref 12.0–15.0)
Immature Granulocytes: 0 %
LYMPHS PCT: 32 %
Lymphs Abs: 3.1 10*3/uL (ref 0.7–4.0)
MCH: 26.8 pg (ref 26.0–34.0)
MCHC: 31.2 g/dL (ref 30.0–36.0)
MCV: 86.1 fL (ref 80.0–100.0)
Monocytes Absolute: 0.5 10*3/uL (ref 0.1–1.0)
Monocytes Relative: 5 %
NEUTROS ABS: 5.7 10*3/uL (ref 1.7–7.7)
NEUTROS PCT: 60 %
NRBC: 0 % (ref 0.0–0.2)
PLATELETS: 309 10*3/uL (ref 150–400)
RBC: 4.81 MIL/uL (ref 3.87–5.11)
RDW: 13.8 % (ref 11.5–15.5)
WBC: 9.6 10*3/uL (ref 4.0–10.5)

## 2018-05-21 LAB — URINALYSIS, ROUTINE W REFLEX MICROSCOPIC
Bilirubin Urine: NEGATIVE
GLUCOSE, UA: 150 mg/dL — AB
Hgb urine dipstick: NEGATIVE
KETONES UR: NEGATIVE mg/dL
LEUKOCYTES UA: NEGATIVE
NITRITE: NEGATIVE
PH: 8 (ref 5.0–8.0)
Protein, ur: NEGATIVE mg/dL
Specific Gravity, Urine: 1.017 (ref 1.005–1.030)

## 2018-05-21 LAB — LIPASE, BLOOD: Lipase: 32 U/L (ref 11–51)

## 2018-05-21 LAB — PROTIME-INR
INR: 0.94
Prothrombin Time: 12.5 seconds (ref 11.4–15.2)

## 2018-05-21 MED ORDER — ENOXAPARIN SODIUM 40 MG/0.4ML ~~LOC~~ SOLN
40.0000 mg | SUBCUTANEOUS | Status: DC
  Administered 2018-05-21: 40 mg via SUBCUTANEOUS
  Filled 2018-05-21: qty 0.4

## 2018-05-21 MED ORDER — HYDROMORPHONE HCL 1 MG/ML IJ SOLN: 0.5000 mg | INTRAMUSCULAR | Status: DC | PRN

## 2018-05-21 MED ORDER — PANTOPRAZOLE SODIUM 40 MG IV SOLR
40.0000 mg | Freq: Every day | INTRAVENOUS | Status: DC
  Administered 2018-05-21: 40 mg via INTRAVENOUS
  Filled 2018-05-21: qty 40

## 2018-05-21 MED ORDER — LACTATED RINGERS IV SOLN
INTRAVENOUS | Status: DC
  Administered 2018-05-21 (×2): via INTRAVENOUS

## 2018-05-21 MED ORDER — ACETAMINOPHEN 650 MG RE SUPP: 650.0000 mg | Freq: Four times a day (QID) | RECTAL | Status: DC | PRN

## 2018-05-21 MED ORDER — SIMETHICONE 80 MG PO CHEW: 40.0000 mg | CHEWABLE_TABLET | Freq: Four times a day (QID) | ORAL | Status: DC | PRN

## 2018-05-21 MED ORDER — HYDROCODONE-ACETAMINOPHEN 5-325 MG PO TABS: 1.0000 | ORAL_TABLET | ORAL | Status: DC | PRN

## 2018-05-21 MED ORDER — ONDANSETRON HCL 4 MG/2ML IJ SOLN
4.0000 mg | Freq: Once | INTRAMUSCULAR | Status: AC
  Administered 2018-05-21: 4 mg via INTRAVENOUS
  Filled 2018-05-21: qty 2

## 2018-05-21 MED ORDER — PROMETHAZINE HCL 25 MG/ML IJ SOLN: 12.5000 mg | INTRAMUSCULAR | Status: DC | PRN

## 2018-05-21 MED ORDER — ONDANSETRON HCL 4 MG/2ML IJ SOLN
4.0000 mg | INTRAMUSCULAR | Status: DC | PRN
  Administered 2018-05-21: 4 mg via INTRAVENOUS
  Filled 2018-05-21: qty 2

## 2018-05-21 MED ORDER — ZOLPIDEM TARTRATE 5 MG PO TABS: 5.0000 mg | ORAL_TABLET | Freq: Every evening | ORAL | Status: DC | PRN

## 2018-05-21 MED ORDER — PROMETHAZINE HCL 25 MG/ML IJ SOLN
12.5000 mg | Freq: Once | INTRAMUSCULAR | Status: AC
  Administered 2018-05-21: 12.5 mg via INTRAVENOUS
  Filled 2018-05-21: qty 1

## 2018-05-21 MED ORDER — METOPROLOL TARTRATE 5 MG/5ML IV SOLN: 5.0000 mg | Freq: Four times a day (QID) | INTRAVENOUS | Status: DC | PRN

## 2018-05-21 MED ORDER — MORPHINE SULFATE (PF) 4 MG/ML IV SOLN
4.0000 mg | Freq: Once | INTRAVENOUS | Status: AC
Start: 2018-05-21 — End: 2018-05-21
  Administered 2018-05-21: 4 mg via INTRAVENOUS
  Filled 2018-05-21: qty 1

## 2018-05-21 MED ORDER — DICYCLOMINE HCL 10 MG PO CAPS
10.0000 mg | ORAL_CAPSULE | Freq: Once | ORAL | Status: AC
  Administered 2018-05-21: 10 mg via ORAL
  Filled 2018-05-21: qty 1

## 2018-05-21 MED ORDER — ONDANSETRON HCL 4 MG/2ML IJ SOLN: 4.0000 mg | INTRAMUSCULAR | Status: DC | PRN

## 2018-05-21 MED ORDER — DIPHENOXYLATE-ATROPINE 2.5-0.025 MG PO TABS
2.0000 | ORAL_TABLET | Freq: Once | ORAL | Status: AC
  Administered 2018-05-21: 2 via ORAL
  Filled 2018-05-21: qty 2

## 2018-05-21 MED ORDER — SODIUM CHLORIDE 0.9 % IV BOLUS
1000.0000 mL | Freq: Once | INTRAVENOUS | Status: AC
  Administered 2018-05-21: 1000 mL via INTRAVENOUS

## 2018-05-21 MED ORDER — DIPHENHYDRAMINE HCL 50 MG/ML IJ SOLN: 12.5000 mg | Freq: Four times a day (QID) | INTRAMUSCULAR | Status: DC | PRN

## 2018-05-21 MED ORDER — ALUM & MAG HYDROXIDE-SIMETH 200-200-20 MG/5ML PO SUSP
30.0000 mL | Freq: Once | ORAL | Status: AC
  Administered 2018-05-21: 30 mL via ORAL
  Filled 2018-05-21: qty 30

## 2018-05-21 MED ORDER — ACETAMINOPHEN 325 MG PO TABS: 650.0000 mg | ORAL_TABLET | Freq: Four times a day (QID) | ORAL | Status: DC | PRN

## 2018-05-21 MED ORDER — DIPHENHYDRAMINE HCL 12.5 MG/5ML PO ELIX: 12.5000 mg | ORAL_SOLUTION | Freq: Four times a day (QID) | ORAL | Status: DC | PRN

## 2018-05-21 MED ORDER — HYDROMORPHONE HCL 1 MG/ML IJ SOLN
0.5000 mg | INTRAMUSCULAR | Status: DC | PRN
  Administered 2018-05-21: 0.5 mg via INTRAVENOUS
  Filled 2018-05-21: qty 1

## 2018-05-21 NOTE — ED Provider Notes (Signed)
Pt received at sign out with [name] pending. Pt presented to ED with c/o upper abd pain that began after eating last night, associated with diarrhea. LFT's elevated, WBC count normal and pt remains afebrile. See previous EDP note for full HPI/MDM.  [name] with gallstones, no acute cholecystitis or obstruction. Pt now with N/V and c/o return of pain. 1000:  T/C returned from General Surgery Dr. Henreitta Leber, case discussed, including:  HPI, pertinent PM/SHx, VS/PE, dx testing, ED course and treatment:  Agreeable to come to ED for evaluation for admission.   Patient Vitals for the past 24 hrs:  BP Temp Temp src Pulse Resp SpO2 Height Weight  05/21/18 0800 113/62 - - 60 18 97 % - -  05/21/18 0721 129/69 98.5 F (36.9 C) Oral 62 18 98 % - -  05/21/18 0541 (!) 157/91 97.7 F (36.5 C) Oral 83 16 97 % - -  05/21/18 0540 - - - - - - 5\' 4"  (1.626 m) 98 kg     Results for orders placed or performed during the hospital encounter of 05/21/18  CBC with Differential/Platelet  Result Value Ref Range   WBC 9.6 4.0 - 10.5 K/uL   RBC 4.81 3.87 - 5.11 MIL/uL   Hemoglobin 12.9 12.0 - 15.0 g/dL   HCT 16.1 09.6 - 04.5 %   MCV 86.1 80.0 - 100.0 fL   MCH 26.8 26.0 - 34.0 pg   MCHC 31.2 30.0 - 36.0 g/dL   RDW 40.9 81.1 - 91.4 %   Platelets 309 150 - 400 K/uL   nRBC 0.0 0.0 - 0.2 %   Neutrophils Relative % 60 %   Neutro Abs 5.7 1.7 - 7.7 K/uL   Lymphocytes Relative 32 %   Lymphs Abs 3.1 0.7 - 4.0 K/uL   Monocytes Relative 5 %   Monocytes Absolute 0.5 0.1 - 1.0 K/uL   Eosinophils Relative 3 %   Eosinophils Absolute 0.3 0.0 - 0.5 K/uL   Basophils Relative 0 %   Basophils Absolute 0.0 0.0 - 0.1 K/uL   Immature Granulocytes 0 %   Abs Immature Granulocytes 0.01 0.00 - 0.07 K/uL  Comprehensive metabolic panel  Result Value Ref Range   Sodium 138 135 - 145 mmol/L   Potassium 3.5 3.5 - 5.1 mmol/L   Chloride 105 98 - 111 mmol/L   CO2 26 22 - 32 mmol/L   Glucose, Bld 119 (H) 70 - 99 mg/dL   BUN 9 6 - 20 mg/dL   Creatinine, Ser 7.82 0.44 - 1.00 mg/dL   Calcium 8.9 8.9 - 95.6 mg/dL   Total Protein 7.8 6.5 - 8.1 g/dL   Albumin 4.0 3.5 - 5.0 g/dL   AST 213 (H) 15 - 41 U/L   ALT 225 (H) 0 - 44 U/L   Alkaline Phosphatase 112 38 - 126 U/L   Total Bilirubin 1.0 0.3 - 1.2 mg/dL   GFR calc non Af Amer >60 >60 mL/min   GFR calc Af Amer >60 >60 mL/min   Anion gap 7 5 - 15  Urinalysis, Routine w reflex microscopic  Result Value Ref Range   Color, Urine YELLOW YELLOW   APPearance HAZY (A) CLEAR   Specific Gravity, Urine 1.017 1.005 - 1.030   pH 8.0 5.0 - 8.0   Glucose, UA 150 (A) NEGATIVE mg/dL   Hgb urine dipstick NEGATIVE NEGATIVE   Bilirubin Urine NEGATIVE NEGATIVE   Ketones, ur NEGATIVE NEGATIVE mg/dL   Protein, ur NEGATIVE NEGATIVE mg/dL   Nitrite  NEGATIVE NEGATIVE   Leukocytes, UA NEGATIVE NEGATIVE  Lipase, blood  Result Value Ref Range   Lipase 32 11 - 51 U/L  Protime-INR  Result Value Ref Range   Prothrombin Time 12.5 11.4 - 15.2 seconds   INR 0.94     US Abdomen Limited Ruq Result Date: 05/21/2018 CLINICAL DATA:  Right upper quadrant abdominal pain with abnormal liver function tests. EXAM: ULTRASOUND ABDOMEN LIMITED RIGHT UPPER QUADRANT COMPARISON:  None. FINDINGS: Gallbladder: Multiple stones mobile within the gallbladder, largest 1 cm. No Murphy sign. No wall thickening. No surrounding fluid. Common bile duct: Diameter: 3.8 mm.  Normal. Liver: No focal lesion identified. Within normal limits in parenchymal echogenicity. Portal vein is patent on color Doppler imaging with normal direction of blood flow towards the liver. IMPRESSION: Multiple gallstones mobile within the gallbladder. Largest stone 1 cm. No sonographic evidence of cholecystitis or obstruction. Electronically Signed   By: Paulina Fusi M.D.   On: 05/21/2018 09:47      Samuel Jester, DO 05/21/18 1027

## 2018-05-21 NOTE — ED Triage Notes (Signed)
Pt C/O mid abdominal pain that started around 1930 last night. Pt denies N/V but does report diarrhea. Pt denies fevers.

## 2018-05-21 NOTE — ED Notes (Signed)
Dr. Henreitta Leber at bedside at this time.

## 2018-05-21 NOTE — H&P (Signed)
[facility] History and Physical  Reason for Referral: Abdominal pain, intractable nausea/vomiting ? Gallstones  Referring Physician:  Dr. Thurnell Garbe   Chief Complaint    Abdominal Pain      [name] is a 47 y.o. female.  HPI: [name] is a 47 yo relatively healthy patient with reported acute onset of epigastric pain last night after dinner at 730. She describes the pain as constant and throughout her upper abdomen. She says it is worse than any stomach ache she ever had. She came to the ED at about 5AM after no improvement. She had taken some mylanta, which caused her to have some diarrhea she says, but did not improve the pain.  In the ED she has had nausea/vomiting that is refractory to zofran and elevated AST/ ALT but normal Alk phos and T bili. An [name] does demonstrate gallstones, but no signs of acute cholecystitis. No reported alcohol intake, no medication changes and no reported exposures she is aware of at this time.   She continues to have the pain and nausea/vomiting at this time. The meal was fried chicken but prior to this episode she denies ever having pain after fatty meals.   Past Medical History:  Diagnosis Date  . Vertigo     Past Surgical History:  Procedure Laterality Date  . ABDOMINAL HYSTERECTOMY    . CESAREAN SECTION      Family History  Problem Relation Age of Onset  . Hypertension Mother   . Diabetes Mother     Social History   Tobacco Use  . Smoking status: Never Smoker  . Smokeless tobacco: Never Used  Substance Use Topics  . Alcohol use: No  . Drug use: No    Medications:  I have reviewed the patient's current medications. Prior to Admission:  (Not in a hospital admission) Scheduled: . enoxaparin (LOVENOX) injection  40 mg Subcutaneous Q24H  . pantoprazole (PROTONIX) IV  40 mg Intravenous QHS   Continuous: . lactated ringers     ZOX:[id number] **OR** acetaminophen, diphenhydrAMINE **OR** diphenhydrAMINE,  HYDROcodone-acetaminophen, HYDROmorphone (DILAUDID) injection, metoprolol tartrate, ondansetron, promethazine, simethicone, zolpidem  Allergies: No Known Allergies  ROS:  A comprehensive review of systems was negative except for: Gastrointestinal: positive for abdominal pain, diarrhea, nausea and vomiting  Blood pressure 110/62, pulse 62, temperature 98.5 F (36.9 C), temperature source Oral, resp. rate 16, height '5\' 4"'  (1.626 m), weight 98 kg, SpO2 95 %. Physical Exam  Constitutional: She is oriented to person, place, and time. She appears well-developed and well-nourished.  HENT:  Head: Normocephalic and atraumatic.  Cardiovascular: Normal rate.  Pulmonary/Chest: Effort normal.  Abdominal: Soft. Normal appearance. There is tenderness in the right upper quadrant and epigastric area. There is no rebound and no guarding. No hernia.  Musculoskeletal:  Moves all extremities, no edema  Neurological: She is alert and oriented to person, place, and time.  Skin: Skin is warm and dry.  Psychiatric: She has a normal mood and affect. Her behavior is normal.  Vitals reviewed.   Results: AST/ ALT up but normal T bili/ Alk phos  Results for orders placed or performed during the hospital encounter of 05/21/18 (from the past 48 hour(s))  CBC with Differential/Platelet     Status: None   Collection Time: 05/21/18  5:59 AM  Result Value Ref Range   WBC 9.6 4.0 - 10.5 K/uL   RBC 4.81 3.87 - 5.11 MIL/uL   Hemoglobin 12.9 12.0 - 15.0 g/dL   HCT 41.4 36.0 -  46.0 %   MCV 86.1 80.0 - 100.0 fL   MCH 26.8 26.0 - 34.0 pg   MCHC 31.2 30.0 - 36.0 g/dL   RDW 13.8 11.5 - 15.5 %   Platelets 309 150 - 400 K/uL   nRBC 0.0 0.0 - 0.2 %   Neutrophils Relative % 60 %   Neutro Abs 5.7 1.7 - 7.7 K/uL   Lymphocytes Relative 32 %   Lymphs Abs 3.1 0.7 - 4.0 K/uL   Monocytes Relative 5 %   Monocytes Absolute 0.5 0.1 - 1.0 K/uL   Eosinophils Relative 3 %   Eosinophils Absolute 0.3 0.0 - 0.5 K/uL   Basophils  Relative 0 %   Basophils Absolute 0.0 0.0 - 0.1 K/uL   Immature Granulocytes 0 %   Abs Immature Granulocytes 0.01 0.00 - 0.07 K/uL    Comment: Performed at [facility], [address] [place], [place] [address]  Comprehensive metabolic panel     Status: Abnormal   Collection Time: 05/21/18  5:59 AM  Result Value Ref Range   Sodium 138 135 - 145 mmol/L   Potassium 3.5 3.5 - 5.1 mmol/L   Chloride 105 98 - 111 mmol/L   CO2 26 22 - 32 mmol/L   Glucose, Bld 119 (H) 70 - 99 mg/dL   BUN 9 6 - 20 mg/dL   Creatinine, Ser 0.77 0.44 - 1.00 mg/dL   Calcium 8.9 8.9 - 10.3 mg/dL   Total Protein 7.8 6.5 - 8.1 g/dL   Albumin 4.0 3.5 - 5.0 g/dL   AST 440 (H) 15 - 41 U/L   ALT 225 (H) 0 - 44 U/L   Alkaline Phosphatase 112 38 - 126 U/L   Total Bilirubin 1.0 0.3 - 1.2 mg/dL   GFR calc non Af Amer >60 >60 mL/min   GFR calc Af Amer >60 >60 mL/min    Comment: (NOTE) The eGFR has been calculated using the CKD EPI equation. This calculation has not been validated in all clinical situations. eGFR's persistently <60 mL/min signify possible Chronic Kidney Disease.    Anion gap 7 5 - 15    Comment: Performed at [facility], [address] [place], [place] [address]  Lipase, blood     Status: None   Collection Time: 05/21/18  5:59 AM  Result Value Ref Range   Lipase 32 11 - 51 U/L    Comment: Performed at [facility], [address] [place], [place] [address]  Urinalysis, Routine w reflex microscopic     Status: Abnormal   Collection Time: 05/21/18  7:17 AM  Result Value Ref Range   Color, Urine YELLOW YELLOW   APPearance HAZY (A) CLEAR   Specific Gravity, Urine 1.017 1.005 - 1.030   pH 8.0 5.0 - 8.0   Glucose, UA 150 (A) NEGATIVE mg/dL   Hgb urine dipstick NEGATIVE NEGATIVE   Bilirubin Urine NEGATIVE NEGATIVE   Ketones, ur NEGATIVE NEGATIVE mg/dL   Protein, ur NEGATIVE NEGATIVE mg/dL   Nitrite NEGATIVE NEGATIVE   Leukocytes, UA NEGATIVE NEGATIVE    Comment: Performed at [facility],  [address], [place], [place] [address]  Protime-INR     Status: None   Collection Time: 05/21/18  8:08 AM  Result Value Ref Range   Prothrombin Time 12.5 11.4 - 15.2 seconds   INR 0.94     Comment: Performed at [facility], [address] [place], [place] [address]   Personally reviewed [name] - stones, but no wall thickening noted  Dg Chest Port 1 View  Result Date: 05/21/2018 CLINICAL DATA:  Preoperative evaluation. Cholelithiasis. EXAM: PORTABLE CHEST 1 VIEW COMPARISON:  Chest radiograph 01/21/2017 FINDINGS: Stable enlarged cardiac and mediastinal contours. No consolidative pulmonary opacities. No pleural effusion or pneumothorax. IMPRESSION: No acute cardiopulmonary process. Electronically Signed   By: Lovey Newcomer M.D.   On: 05/21/2018 12:21   US Abdomen Limited Ruq  Result Date: 05/21/2018 CLINICAL DATA:  Right upper quadrant abdominal pain with abnormal liver function tests. EXAM: ULTRASOUND ABDOMEN LIMITED RIGHT UPPER QUADRANT COMPARISON:  None. FINDINGS: Gallbladder: Multiple stones mobile within the gallbladder, largest 1 cm. No Murphy sign. No wall thickening. No surrounding fluid. Common bile duct: Diameter: 3.8 mm.  Normal. Liver: No focal lesion identified. Within normal limits in parenchymal echogenicity. Portal vein is patent on color Doppler imaging with normal direction of blood flow towards the liver. IMPRESSION: Multiple gallstones mobile within the gallbladder. Largest stone 1 cm. No sonographic evidence of cholecystitis or obstruction. Electronically Signed   By: Nelson Chimes M.D.   On: 05/21/2018 09:47    Assessment & Plan:  DORELLA LASTER is a 47 y.o. female with gallstones and abdominal pain/ nausea vomiting. It is possible that this is from biliary colic but she denies ever having anything like this prior. She has elevated AST/ ALT of unknown etiology, but no Alk phos or T bili which go more against biliary disease.  At this time she is unable to tolerate a diet and go  home. We have discussed the possibility of a viral gastroenteritis, gallbladder disease, ulcer disease / gastritis, and have decided to admit her for observation and serial labs.    -Observation  -Clear liquid diet ok, NPO at midnight pending need for OR -May need to do HIDA In AM if still unclear  -Repeat labs in the AM -CXR/ EKG now to be ready if needs OR  -Nausea meds ordered PRN  -Hepatitis panel pending given AST/ ALT elevation   All questions were answered to the satisfaction of the patient.  Virl Cagey 05/21/2018, 1:47 PM

## 2018-05-21 NOTE — ED Provider Notes (Signed)
[facility] EMERGENCY DEPARTMENT Provider Note   CSN: [id number] Arrival date & time: 05/21/18  4098     History   Chief Complaint Chief Complaint  Patient presents with  . Abdominal Pain    HPI [name] is a 47 y.o. female.  Presents to the emergency department for evaluation of abdominal pain.  Patient reports that she started having abdominal pain around 730 last night after eating dinner.  Patient reports persistent pain for several hours and then started having diarrhea around midnight.  Pain has continued, reports a crampy pain diffusely across the upper abdomen with a burning sensation.  She has not had nausea or vomiting.  She has not had a fever.     Past Medical History:  Diagnosis Date  . Vertigo     There are no active problems to display for this patient.   Past Surgical History:  Procedure Laterality Date  . ABDOMINAL HYSTERECTOMY    . CESAREAN SECTION       OB History   None      Home Medications    Prior to Admission medications   Medication Sig Start Date End Date Taking? Authorizing Provider  meclizine (ANTIVERT) 25 MG tablet Take 1 tablet (25 mg total) by mouth 3 (three) times daily as needed for dizziness. 01/21/17   Bethann Berkshire, MD  ondansetron (ZOFRAN ODT) 4 MG disintegrating tablet 4mg  ODT q4 hours prn nausea/vomit 01/21/17   Bethann Berkshire, MD    Family History No family history on file.  Social History Social History   Tobacco Use  . Smoking status: Never Smoker  . Smokeless tobacco: Never Used  Substance Use Topics  . Alcohol use: No  . Drug use: No     Allergies   Patient has no known allergies.   Review of Systems Review of Systems  Gastrointestinal: Positive for abdominal pain and diarrhea.  All other systems reviewed and are negative.    Physical Exam Updated Vital Signs BP 129/69   Pulse 62   Temp 98.5 F (36.9 C) (Oral)   Resp 18   Ht 5\' 4"  (1.626 m)   Wt 98 kg   SpO2 98%   BMI 37.08  kg/m   Physical Exam  Constitutional: She is oriented to person, place, and time. She appears well-developed and well-nourished. No distress.  HENT:  Head: Normocephalic and atraumatic.  Right Ear: Hearing normal.  Left Ear: Hearing normal.  Nose: Nose normal.  Mouth/Throat: Oropharynx is clear and moist and mucous membranes are normal.  Eyes: Pupils are equal, round, and reactive to light. Conjunctivae and EOM are normal.  Neck: Normal range of motion. Neck supple.  Cardiovascular: Regular rhythm, S1 normal and S2 normal. Exam reveals no gallop and no friction rub.  No murmur heard. Pulmonary/Chest: Effort normal and breath sounds normal. No respiratory distress. She exhibits no tenderness.  Abdominal: Soft. Normal appearance and bowel sounds are normal. There is no hepatosplenomegaly. There is tenderness in the epigastric area. There is no rebound, no guarding, no tenderness at McBurney's point and negative Murphy's sign. No hernia.  Musculoskeletal: Normal range of motion.  Neurological: She is alert and oriented to person, place, and time. She has normal strength. No cranial nerve deficit or sensory deficit. Coordination normal. GCS eye subscore is 4. GCS verbal subscore is 5. GCS motor subscore is 6.  Skin: Skin is warm, dry and intact. No rash noted. No cyanosis.  Psychiatric: She has a normal mood and affect. Her  speech is normal and behavior is normal. Thought content normal.  Nursing note and vitals reviewed.    ED Treatments / Results  Labs (all labs ordered are listed, but only abnormal results are displayed) Labs Reviewed  COMPREHENSIVE METABOLIC PANEL - Abnormal; Notable for the following components:      Result Value   Glucose, Bld 119 (*)    AST 440 (*)    ALT 225 (*)    All other components within normal limits  CBC WITH DIFFERENTIAL/PLATELET  LIPASE, BLOOD  URINALYSIS, ROUTINE W REFLEX MICROSCOPIC    EKG None  Radiology No results  found.  Procedures Procedures (including critical care time)  Medications Ordered in ED Medications  alum & mag hydroxide-simeth (MAALOX/MYLANTA) 200-200-20 MG/5ML suspension 30 mL (30 mLs Oral Given 05/21/18 0608)  dicyclomine (BENTYL) capsule 10 mg (10 mg Oral Given 05/21/18 7829)  diphenoxylate-atropine (LOMOTIL) 2.5-0.025 MG per tablet 2 tablet (2 tablets Oral Given 05/21/18 5621)  morphine 4 MG/ML injection 4 mg (4 mg Intravenous Given 05/21/18 0608)  ondansetron (ZOFRAN) injection 4 mg (4 mg Intravenous Given 05/21/18 0608)  sodium chloride 0.9 % bolus 1,000 mL (0 mLs Intravenous Stopped 05/21/18 0725)     Initial Impression / Assessment and Plan / ED Course  I have reviewed the triage vital signs and the nursing notes.  Pertinent labs & imaging results that were available during my care of the patient were reviewed by me and considered in my medical decision making (see chart for details).     Patient presents to the emergency department for evaluation of abdominal pain.  Symptoms began yesterday at around 7:30 PM after eating.  Pain has been persistent since.  She reports a burning sensation that she has never felt before.  She took Maalox without relief but then started having diarrhea.  Abdominal examination reveals epigastric tenderness but no right upper quadrant tenderness.  She does still have a gallbladder.  She is afebrile, vital signs are stable.  White blood cell count is normal, 9.6.  Patient's lipase is normal but she does have elevated AST and ALT at 440 and 225 respectively.  Denies any alcohol use.  She does not take regular prescription medications and does not use any supplements.  Will perform acute hepatitis panel and obtain right upper quadrant ultrasound. Will sign out to oncoming ER physician to follow up US.  Final Clinical Impressions(s) / ED Diagnoses   Final diagnoses:  Pain of upper abdomen    ED Discharge Orders    None       Angeleena Dueitt,  Canary Brim, MD 05/21/18 0730

## 2018-05-21 NOTE — ED Notes (Signed)
PT vomiting at this time. New order for phenergan.

## 2018-05-22 ENCOUNTER — Encounter (HOSPITAL_COMMUNITY)
Admission: EM | Disposition: A | Discharge status: Home / Self Care | Payer: Self-pay | Source: Home / Self Care | Attending: Emergency Medicine

## 2018-05-22 DIAGNOSIS — K8050 Calculus of bile duct without cholangitis or cholecystitis without obstruction: Secondary | ICD-10-CM | POA: Diagnosis not present

## 2018-05-22 DIAGNOSIS — R740 Nonspecific elevation of levels of transaminase and lactic acid dehydrogenase [LDH]: Secondary | ICD-10-CM | POA: Diagnosis not present

## 2018-05-22 DIAGNOSIS — R112 Nausea with vomiting, unspecified: Secondary | ICD-10-CM | POA: Diagnosis not present

## 2018-05-22 LAB — COMPREHENSIVE METABOLIC PANEL
ALK PHOS: 90 U/L (ref 38–126)
ALT: 304 U/L — AB (ref 0–44)
ANION GAP: 5 (ref 5–15)
AST: 198 U/L — ABNORMAL HIGH (ref 15–41)
Albumin: 3 g/dL — ABNORMAL LOW (ref 3.5–5.0)
BUN: 5 mg/dL — ABNORMAL LOW (ref 6–20)
CALCIUM: 8.3 mg/dL — AB (ref 8.9–10.3)
CO2: 28 mmol/L (ref 22–32)
Chloride: 107 mmol/L (ref 98–111)
Creatinine, Ser: 0.93 mg/dL (ref 0.44–1.00)
GFR calc non Af Amer: >60 mL/min (ref >60)
GLUCOSE: 95 mg/dL (ref 70–99)
Potassium: 3.5 mmol/L (ref 3.5–5.1)
SODIUM: 140 mmol/L (ref 135–145)
Total Bilirubin: 0.9 mg/dL (ref 0.3–1.2)
Total Protein: 6.1 g/dL — ABNORMAL LOW (ref 6.5–8.1)

## 2018-05-22 LAB — CBC WITH DIFFERENTIAL/PLATELET
Abs Immature Granulocytes: 0.01 10*3/uL (ref 0.00–0.07)
BASOS ABS: 0 10*3/uL (ref 0.0–0.1)
Basophils Relative: 0 %
EOS ABS: 0.3 10*3/uL (ref 0.0–0.5)
Eosinophils Relative: 4 %
HEMATOCRIT: 36.7 % (ref 36.0–46.0)
Hemoglobin: 11.4 g/dL — ABNORMAL LOW (ref 12.0–15.0)
IMMATURE GRANULOCYTES: 0 %
LYMPHS ABS: 2.9 10*3/uL (ref 0.7–4.0)
LYMPHS PCT: 45 %
MCH: 27.7 pg (ref 26.0–34.0)
MCHC: 31.1 g/dL (ref 30.0–36.0)
MCV: 89.1 fL (ref 80.0–100.0)
MONOS PCT: 7 %
Monocytes Absolute: 0.4 10*3/uL (ref 0.1–1.0)
NEUTROS PCT: 44 %
NRBC: 0 % (ref 0.0–0.2)
Neutro Abs: 2.9 10*3/uL (ref 1.7–7.7)
Platelets: 263 10*3/uL (ref 150–400)
RBC: 4.12 MIL/uL (ref 3.87–5.11)
RDW: 13.8 % (ref 11.5–15.5)
WBC: 6.5 10*3/uL (ref 4.0–10.5)

## 2018-05-22 LAB — HEPATITIS PANEL, ACUTE
HCV Ab: <0.1 s/co ratio (ref 0.0–0.9)
Hep A IgM: NEGATIVE
Hep B C IgM: NEGATIVE
Hepatitis B Surface Ag: NEGATIVE

## 2018-05-22 LAB — HIV ANTIBODY (ROUTINE TESTING W REFLEX): HIV Screen 4th Generation wRfx: NONREACTIVE

## 2018-05-22 SURGERY — LAPAROSCOPIC CHOLECYSTECTOMY
Anesthesia: General

## 2018-05-22 NOTE — Discharge Summary (Signed)
Physician Discharge Summary  Patient ID: TEMITOPE FLAMMER MRN: [id number] DOB/AGE: [birth year] 45 y.o.  Admit date: 05/21/2018 Discharge date: 05/22/2018  Admission Diagnoses:  Discharge Diagnoses:  Principal Problem:   Intractable vomiting with nausea Active Problems:   Gallstones   Transaminitis   Discharged Condition: good  Hospital Course: Ms. Hewett 47 yo otherwise healthy lady who presented with abdominal pain, nausea vomiting after eating some fried chicken. She had elevated transaminese but otherwise normal LFTs. She had stones on her [name] and no other concerning findings. Her story partly went with gallstones but also had some aspects more like a gastroenteritis with her pain being more epigastric, the transaminitis, and the diarrhea.  She was brought in for observation and felt much improved this am.  Given the improvement, she has opted for outpatient followup and discussion of getting her gallbladder out electively, pending issues.   Consults: None  Significant Diagnostic Studies: [name]- stones in the gallbladder, no signs of cholecystitis   Treatments: IV hydration  Discharge Exam: Blood pressure 127/70, pulse (!) 57, temperature 97.8 F (36.6 C), temperature source Oral, resp. rate 14, height 5\' 4"  (1.626 m), weight 97.2 kg, SpO2 99 %. General appearance: alert, cooperative and no distress Resp: normal work breathing GI: soft, non-tender; bowel sounds normal; no masses,  no organomegaly  Disposition: Discharge disposition: 01-Home or Self Care       Discharge Instructions    Call MD for:  extreme fatigue   Complete by:  As directed    Call MD for:  persistant nausea and vomiting   Complete by:  As directed    Call MD for:  redness, tenderness, or signs of infection (pain, swelling, redness, odor or green/yellow discharge around incision site)   Complete by:  As directed    Call MD for:  severe uncontrolled pain   Complete by:  As directed    Call MD  for:  temperature >100.4   Complete by:  As directed    Diet - low sodium heart healthy   Complete by:  As directed    Increase activity slowly   Complete by:  As directed      Allergies as of 05/22/2018   No Known Allergies     Medication List    TAKE these medications   naproxen sodium 220 MG tablet Commonly known as:  ALEVE Take 220 mg by mouth daily as needed.      Follow-up Information    Lucretia Roers, MD Follow up on 05/25/2018.   Specialty:  General Surgery Why:  05/25/2018 @ 3:15PM Contact information: [address] [place] [address] [phone]           Signed: Lucretia Roers 05/22/2018, 8:50 AM

## 2018-05-22 NOTE — Discharge Instructions (Signed)
AST/ ALT were the liver test that were elevated.   Aspartate Aminotransferase (AST) Test Why am I having this test? Aspartate aminotransferase (AST) test identifies suspected liver diseases. AST is an enzyme that is released into the blood when liver or muscle cells are injured. This test is usually done when there are signs of liver problems. What kind of sample is taken? A blood sample is required for this test. It is usually collected by inserting a needle into a vein. How do I prepare for this test?  Your health care provider may instruct you to avoid taking medicines, particularly those that require intramuscular injection (IM) 12 hours before the test. Follow your health care provider's instructions.  This test usually requires that a blood sample be collected once each day for three days, and then again in one week. What are the reference ranges? Reference ranges are considered healthy ranges established after testing a large group of healthy people. Reference ranges may vary among different people, labs, and hospitals. It is your responsibility to obtain your test results. Ask the lab or department performing the test when and how you will get your results. Reference ranges for AST are the following:  32-76 days old: 35-140 units/L.  Less than 66 years old: 15-60 units/L.  11-85 years old: 15-50 units/L.  74-35 years old: 10-50 units/L.  59-41 years old: 10-40 units/L.  Adult: 0-35 units/L or 0-0.58 microkat/L.  Elderly: The reference range is slightly higher than the adult range.  Females tend to have slightly lower levels than males. What do the results mean? Results higher than the reference ranges may indicate:  Liver diseases.  Skeletal muscle diseases.  Other disease that destroy the red blood cells or tissues of the pancreas.  Results lower than the reference ranges may indicate:  Acute kidney disease.  Pregnancy.  Diabetic ketoacidosis.  Chronic kidney  dialysis.  Symptomatic vitamin B1 deficiency.  Talk with your health care provider to discuss your results, treatment options, and if necessary, the need for more tests. Talk with your health care provider if you have any questions about your results. Talk with your health care provider to discuss your results, treatment options, and if necessary, the need for more tests. Talk with your health care provider if you have any questions about your results. This information is not intended to replace advice given to you by your health care provider. Make sure you discuss any questions you have with your health care provider. Document Released: 07/21/2004 Document Revised: 03/01/2016 Document Reviewed: 11/26/2013 Elsevier Interactive Patient Education  2018 [employer] Alanine Aminotransferase Test Why am I having this test? Alanine aminotransferase (ALT) is an enzyme that is found mainly in the liver but also in the kidney, heart, and skeletal muscle. Under normal conditions, ALT levels in the blood are low. Elevated levels may be caused by:  Damage to the liver. This is the main cause of an abnormality.  Damage to the kidney, heart, and skeletal muscle.  Certain medicines.  ALT is tested in long-standing liver disease. It may be used to monitor the treatment of people who have liver disease or to see if the treatment is working. ALT may be ordered either by itself or along with other tests. What kind of sample is taken? A blood sample is required for this test. It is usually collected by inserting a needle into a vein. How do I prepare for this test? There is no preparation or fasting for this test. What are  the reference ranges? Reference ranges are considered healthy ranges established after testing a large group of healthy people. Reference ranges may vary among different people, labs, and hospitals. It is your responsibility to obtain your test results. Ask the lab or department  performing the test when and how you will get your results. The reference ranges for the ALT test are as follows:  Adult or child: 4-36 International Units per liter.  Elderly adult: may be slightly higher than adult values.  Infant: may be twice as high as adult values.  Values may be higher in men and African Americans.  What do the results mean? Test results that are significantly elevated may indicate:  Hepatitis.  Liver tissue death (hepatic necrosis).  Decreased blood flow to the liver (hepatic ischemia).  Test results that are moderately elevated may indicate:  Cirrhosis.  Liver tumor.  Medicines that are causing strain on the liver.  Severe burns.  Test results that are mildly elevated may indicate:  Pancreatitis.  Heart attack.  Infectious mononucleosis.  Shock.  Talk with your health care provider to discuss your results, treatment options, and if necessary, the need for more tests. Talk with your health care provider if you have any questions about your results. Talk with your health care provider to discuss your results, treatment options, and if necessary, the need for more tests. Talk with your health care provider if you have any questions about your results. This information is not intended to replace advice given to you by your health care provider. Make sure you discuss any questions you have with your health care provider. Document Released: 07/20/2004 Document Revised: 03/01/2016 Document Reviewed: 11/29/2013 Elsevier Interactive Patient Education  2018 [employer].   Cholelithiasis Cholelithiasis is also called "gallstones." It is a kind of gallbladder disease. The gallbladder is an organ that stores a liquid (bile) that helps you digest fat. Gallstones may not cause symptoms (may be silent gallstones) until they cause a blockage, and then they can cause pain (gallbladder attack). Follow these instructions at home:  Take over-the-counter and  prescription medicines only as told by your doctor.  Stay at a healthy weight.  Eat healthy foods. This includes: ? Eating fewer fatty foods, like fried foods. ? Eating fewer refined carbs (refined carbohydrates). Refined carbs are breads and grains that are highly processed, like white bread and white rice. Instead, choose whole grains like whole-wheat bread and brown rice. ? Eating more fiber. Almonds, fresh fruit, and beans are healthy sources of fiber.  Keep all follow-up visits as told by your doctor. This is important. Contact a doctor if:  You have sudden pain in the upper right side of your belly (abdomen). Pain might spread to your right shoulder or your chest. This may be a sign of a gallbladder attack.  You feel sick to your stomach (are nauseous).  You throw up (vomit).  You have been diagnosed with gallstones that have no symptoms and you get: ? Belly pain. ? Discomfort, burning, or fullness in the upper part of your belly (indigestion). Get help right away if:  You have sudden pain in the upper right side of your belly, and it lasts for more than 2 hours.  You have belly pain that lasts for more than 5 hours.  You have a fever or chills.  You keep feeling sick to your stomach or you keep throwing up.  Your skin or the whites of your eyes turn yellow (jaundice).  You have dark-colored pee (  urine).  You have light-colored poop (stool). Summary  Cholelithiasis is also called "gallstones."  The gallbladder is an organ that stores a liquid (bile) that helps you digest fat.  Silent gallstones are gallstones that do not cause symptoms.  A gallbladder attack may cause sudden pain in the upper right side of your belly. Pain might spread to your right shoulder or your chest. If this happens, contact your doctor.  If you have sudden pain in the upper right side of your belly that lasts for more than 2 hours, get help right away. This information is not intended to  replace advice given to you by your health care provider. Make sure you discuss any questions you have with your health care provider. Document Released: 12/15/2007 Document Revised: 03/14/2016 Document Reviewed: 03/14/2016 Elsevier Interactive Patient Education  2017 [employer].

## 2018-05-22 NOTE — Progress Notes (Signed)
Removed IV-clean, dry, intact. Reviewed d/c paperwork with patient and husband. Answered all questions. Walked stable patient and belongings to elevator with husband.

## 2018-05-25 ENCOUNTER — Encounter: Payer: Self-pay | Admitting: General Surgery

## 2018-05-25 ENCOUNTER — Ambulatory Visit: Payer: BLUE CROSS/BLUE SHIELD | Admitting: General Surgery

## 2018-05-25 ENCOUNTER — Encounter (HOSPITAL_COMMUNITY)
Admission: RE | Admit: 2018-05-25 | Discharge: 2018-05-25 | Disposition: A | Discharge status: Home / Self Care | Payer: BLUE CROSS/BLUE SHIELD | Source: Ambulatory Visit | Attending: General Surgery | Admitting: General Surgery

## 2018-05-25 VITALS — BP 183/97 | HR 67 | Temp 96.8°F | Resp 18 | Wt 218.8 lb

## 2018-05-25 DIAGNOSIS — K8020 Calculus of gallbladder without cholecystitis without obstruction: Secondary | ICD-10-CM

## 2018-05-25 NOTE — H&P (Signed)
[facility] History and Physical      Chief Complaint    Cholelithiasis      [name] is a 47 y.o. female.  HPI: [name] is a 47 yo who initially presented to the hospital with nausea/vomiting and RUQ pain. We were unsure if this was secondary to gallstones or if she was having a gastroenteritis. She was feeling better after 23 hour observation and opted to go home, and to follow up with me soon as an outpatient. She started to having pain, nausea/vomiting again, and has not been able to go back to work because of her symptoms. Her pain is in her upper abdomen, RUQ and is constant and sharp in nature. She has come to clinic today to get her cholecystectomy scheduled.     Not having much of an appetite and has to stop eating. She denies fevers or chills.        Past Medical History:  Diagnosis Date  . Vertigo          Past Surgical History:  Procedure Laterality Date  . ABDOMINAL HYSTERECTOMY    . CESAREAN SECTION           Family History  Problem Relation Age of Onset  . Hypertension Mother   . Diabetes Mother     Social History       Tobacco Use  . Smoking status: Never Smoker  . Smokeless tobacco: Never Used  Substance Use Topics  . Alcohol use: No  . Drug use: No    Medications: I have reviewed the patient's current medications. Allergies as of 05/25/2018   No Known Allergies         Medication List            Accurate as of 05/25/18  3:16 PM. Always use your most recent med list.           naproxen sodium 220 MG tablet Commonly known as:  ALEVE Take 220 mg by mouth daily as needed.        ROS:  A comprehensive review of systems was negative except for: Gastrointestinal: positive for abdominal pain, nausea, vomiting and poor appetite  Blood pressure (!) 183/97, pulse 67, temperature (!) 96.8 F (36 C), temperature source Temporal, resp. rate 18, weight 218 lb 12.8 oz  (99.2 kg). Physical Exam  Constitutional: She is oriented to person, place, and time. She appears well-developed and well-nourished.  HENT:  Head: Normocephalic and atraumatic.  Eyes: Pupils are equal, round, and reactive to light.  Neck: Normal range of motion.  Cardiovascular: Normal rate and regular rhythm.  Pulmonary/Chest: Effort normal and breath sounds normal.  Abdominal: Soft. She exhibits no distension. There is no tenderness.  Musculoskeletal: Normal range of motion. She exhibits no edema.  Neurological: She is alert and oriented to person, place, and time.  Skin: Skin is warm and dry.  Psychiatric: She has a normal mood and affect. Her behavior is normal. Judgment and thought content normal.  Vitals reviewed.   Results: RUQ US 05/2018 IMPRESSION: Multiple gallstones mobile within the gallbladder. Largest stone 1 cm. No sonographic evidence of cholecystitis or obstruction.  Assessment & Plan:  [name] is a 47 y.o. female with cholelithiasis with continued pain, nausea and vomiting. Given her continued symptoms, and inability to go back to work, she needs to go ahead and get her gallbladder out.   -Lap chole in the near future  -PLAN: I counseled the patient about the  indication, risks and benefits of laparoscopic cholecystectomy.  She understands there is a very small chance for bleeding, infection, injury to normal structures (including common bile duct), conversion to open surgery, persistent symptoms, evolution of postcholecystectomy diarrhea, need for secondary interventions, anesthesia reaction, cardiopulmonary issues and other risks not specifically detailed here. I described the expected recovery, the plan for follow-up and the restrictions during the recovery phase.  All questions were answered.   All questions were answered to the satisfaction of the patient and family.   Lucretia Roers 05/25/2018, 3:16 PM

## 2018-05-25 NOTE — Patient Instructions (Signed)
Laparoscopic Cholecystectomy Laparoscopic cholecystectomy is surgery to remove the gallbladder. The gallbladder is a pear-shaped organ that lies beneath the liver on the right side of the body. The gallbladder stores bile, which is a fluid that helps the body to digest fats. Cholecystectomy is often done for inflammation of the gallbladder (cholecystitis). This condition is usually caused by a buildup of gallstones (cholelithiasis) in the gallbladder. Gallstones can block the flow of bile, which can result in inflammation and pain. In severe cases, emergency surgery may be required. This procedure is done though small incisions in your abdomen (laparoscopic surgery). A thin scope with a camera (laparoscope) is inserted through one incision. Thin surgical instruments are inserted through the other incisions. In some cases, a laparoscopic procedure may be turned into a type of surgery that is done through a larger incision (open surgery). Tell a health care provider about:  Any allergies you have.  All medicines you are taking, including vitamins, herbs, eye drops, creams, and over-the-counter medicines.  Any problems you or family members have had with anesthetic medicines.  Any blood disorders you have.  Any surgeries you have had.  Any medical conditions you have.  Whether you are pregnant or may be pregnant. What are the risks? Generally, this is a safe procedure. However, problems may occur, including:  Infection.  Bleeding.  Allergic reactions to medicines.  Damage to other structures or organs.  A stone remaining in the common bile duct. The common bile duct carries bile from the gallbladder into the small intestine.  A bile leak from the cyst duct that is clipped when your gallbladder is removed.  Medicines  Ask your health care provider about: ? Changing or stopping your regular medicines. This is especially important if you are taking diabetes medicines or blood  thinners. ? Taking medicines such as aspirin and ibuprofen. These medicines can thin your blood. Do not take these medicines before your procedure if your health care provider instructs you not to.  You may be given antibiotic medicine to help prevent infection. General instructions  Let your health care provider know if you develop a cold or an infection before surgery.  Plan to have someone take you home from the hospital or clinic.  Ask your health care provider how your surgical site will be marked or identified. What happens during the procedure?  To reduce your risk of infection: ? Your health care team will wash or sanitize their hands. ? Your skin will be washed with soap. ? Hair may be removed from the surgical area.  An IV tube may be inserted into one of your veins.  You will be given one or more of the following: ? A medicine to help you relax (sedative). ? A medicine to make you fall asleep (general anesthetic).  A breathing tube will be placed in your mouth.  Your surgeon will make several small cuts (incisions) in your abdomen.  The laparoscope will be inserted through one of the small incisions. The camera on the laparoscope will send images to a TV screen (monitor) in the operating room. This lets your surgeon see inside your abdomen.  Air-like gas will be pumped into your abdomen. This will expand your abdomen to give the surgeon more room to perform the surgery.  Other tools that are needed for the procedure will be inserted through the other incisions. The gallbladder will be removed through one of the incisions.  Your common bile duct may be examined. If stones are found   in the common bile duct, they may be removed.  After your gallbladder has been removed, the incisions will be closed with stitches (sutures), staples, or skin glue.  Your incisions may be covered with a bandage (dressing). The procedure may vary among health care providers and  hospitals. What happens after the procedure?  Your blood pressure, heart rate, breathing rate, and blood oxygen level will be monitored until the medicines you were given have worn off.  You will be given medicines as needed to control your pain.  Do not drive for 24 hours if you were given a sedative. This information is not intended to replace advice given to you by your health care provider. Make sure you discuss any questions you have with your health care provider. Document Released: 06/28/2005 Document Revised: 01/18/2016 Document Reviewed: 12/15/2015 Elsevier Interactive Patient Education  2018 [employer] Inc[employer]  Cholelithiasis Cholelithiasis is also called "gallstones." It is a kind of gallbladder disease. The gallbladder is an organ that stores a liquid (bile) that helps you digest fat. Gallstones may not cause symptoms (may be silent gallstones) until they cause a blockage, and then they can cause pain (gallbladder attack). Follow these instructions at home:  Take over-the-counter and prescription medicines only as told by your doctor.  Stay at a healthy weight.  Eat healthy foods. This includes: ? Eating fewer fatty foods, like fried foods. ? Eating fewer refined carbs (refined carbohydrates). Refined carbs are breads and grains that are highly processed, like white bread and white rice. Instead, choose whole grains like whole-wheat bread and brown rice. ? Eating more fiber. Almonds, fresh fruit, and beans are healthy sources of fiber.  Keep all follow-up visits as told by your doctor. This is important. Contact a doctor if:  You have sudden pain in the upper right side of your belly (abdomen). Pain might spread to your right shoulder or your chest. This may be a sign of a gallbladder attack.  You feel sick to your stomach (are nauseous).  You throw up (vomit).  You have been diagnosed with gallstones that have no symptoms and you get: ? Belly pain. ? Discomfort, burning,  or fullness in the upper part of your belly (indigestion). Get help right away if:  You have sudden pain in the upper right side of your belly, and it lasts for more than 2 hours.  You have belly pain that lasts for more than 5 hours.  You have a fever or chills.  You keep feeling sick to your stomach or you keep throwing up.  Your skin or the whites of your eyes turn yellow (jaundice).  You have dark-colored pee (urine).  You have light-colored poop (stool). Summary  Cholelithiasis is also called "gallstones."  The gallbladder is an organ that stores a liquid (bile) that helps you digest fat.  Silent gallstones are gallstones that do not cause symptoms.  A gallbladder attack may cause sudden pain in the upper right side of your belly. Pain might spread to your right shoulder or your chest. If this happens, contact your doctor.  If you have sudden pain in the upper right side of your belly that lasts for more than 2 hours, get help right away. This information is not intended to replace advice given to you by your health care provider. Make sure you discuss any questions you have with your health care provider. Document Released: 12/15/2007 Document Revised: 03/14/2016 Document Reviewed: 03/14/2016 Elsevier Interactive Patient Education  2017 Elsevier Inc.  

## 2018-05-25 NOTE — Progress Notes (Signed)
[facility] History and Physical      Chief Complaint    Cholelithiasis      [name] is a 47 y.o. female.  HPI: [name] is a 47 yo who initially presented to the hospital with nausea/vomiting and RUQ pain. We were unsure if this was secondary to gallstones or if she was having a gastroenteritis. She was feeling better after 23 hour observation and opted to go home, and to follow up with me soon as an outpatient. She started to having pain, nausea/vomiting again, and has not been able to go back to work because of her symptoms. Her pain is in her upper abdomen, RUQ and is constant and sharp in nature. She has come to clinic today to get her cholecystectomy scheduled.     Not having much of an appetite and has to stop eating. She denies fevers or chills.        Past Medical History:  Diagnosis Date  . Vertigo          Past Surgical History:  Procedure Laterality Date  . ABDOMINAL HYSTERECTOMY    . CESAREAN SECTION           Family History  Problem Relation Age of Onset  . Hypertension Mother   . Diabetes Mother     Social History       Tobacco Use  . Smoking status: Never Smoker  . Smokeless tobacco: Never Used  Substance Use Topics  . Alcohol use: No  . Drug use: No    Medications: I have reviewed the patient's current medications. Allergies as of 05/25/2018   No Known Allergies         Medication List            Accurate as of 05/25/18  3:16 PM. Always use your most recent med list.           naproxen sodium 220 MG tablet Commonly known as:  ALEVE Take 220 mg by mouth daily as needed.        ROS:  A comprehensive review of systems was negative except for: Gastrointestinal: positive for abdominal pain, nausea, vomiting and poor appetite  Blood pressure (!) 183/97, pulse 67, temperature (!) 96.8 F (36 C), temperature source Temporal, resp. rate 18, weight 218 lb 12.8 oz  (99.2 kg). Physical Exam  Constitutional: She is oriented to person, place, and time. She appears well-developed and well-nourished.  HENT:  Head: Normocephalic and atraumatic.  Eyes: Pupils are equal, round, and reactive to light.  Neck: Normal range of motion.  Cardiovascular: Normal rate and regular rhythm.  Pulmonary/Chest: Effort normal and breath sounds normal.  Abdominal: Soft. She exhibits no distension. There is no tenderness.  Musculoskeletal: Normal range of motion. She exhibits no edema.  Neurological: She is alert and oriented to person, place, and time.  Skin: Skin is warm and dry.  Psychiatric: She has a normal mood and affect. Her behavior is normal. Judgment and thought content normal.  Vitals reviewed.   Results: RUQ US 05/2018 IMPRESSION: Multiple gallstones mobile within the gallbladder. Largest stone 1 cm. No sonographic evidence of cholecystitis or obstruction.  Assessment & Plan:  [name] is a 47 y.o. female with cholelithiasis with continued pain, nausea and vomiting. Given her continued symptoms, and inability to go back to work, she needs to go ahead and get her gallbladder out.   -Lap chole in the near future  -PLAN: I counseled the patient about the   indication, risks and benefits of laparoscopic cholecystectomy.  She understands there is a very small chance for bleeding, infection, injury to normal structures (including common bile duct), conversion to open surgery, persistent symptoms, evolution of postcholecystectomy diarrhea, need for secondary interventions, anesthesia reaction, cardiopulmonary issues and other risks not specifically detailed here. I described the expected recovery, the plan for follow-up and the restrictions during the recovery phase.  All questions were answered.   All questions were answered to the satisfaction of the patient and family.   [name] 05/25/2018, 3:16 PM   

## 2018-05-26 ENCOUNTER — Encounter (HOSPITAL_COMMUNITY): Payer: Self-pay | Admitting: *Deleted

## 2018-05-26 ENCOUNTER — Ambulatory Visit (HOSPITAL_COMMUNITY): Payer: BLUE CROSS/BLUE SHIELD | Admitting: Anesthesiology

## 2018-05-26 ENCOUNTER — Encounter (HOSPITAL_COMMUNITY)
Admission: RE | Disposition: A | Discharge status: Home / Self Care | Payer: Self-pay | Source: Ambulatory Visit | Attending: General Surgery

## 2018-05-26 ENCOUNTER — Ambulatory Visit (HOSPITAL_COMMUNITY)
Admission: RE | Admit: 2018-05-26 | Discharge: 2018-05-26 | Disposition: A | Discharge status: Home / Self Care | Payer: BLUE CROSS/BLUE SHIELD | Source: Ambulatory Visit | Attending: General Surgery | Admitting: General Surgery

## 2018-05-26 DIAGNOSIS — K8010 Calculus of gallbladder with chronic cholecystitis without obstruction: Secondary | ICD-10-CM | POA: Diagnosis not present

## 2018-05-26 DIAGNOSIS — K8020 Calculus of gallbladder without cholecystitis without obstruction: Secondary | ICD-10-CM

## 2018-05-26 HISTORY — PX: CHOLECYSTECTOMY: SHX55

## 2018-05-26 SURGERY — LAPAROSCOPIC CHOLECYSTECTOMY
Anesthesia: General | Site: Abdomen

## 2018-05-26 MED ORDER — SODIUM CHLORIDE 0.9% FLUSH
INTRAVENOUS | Status: AC
  Filled 2018-05-26: qty 10

## 2018-05-26 MED ORDER — PROMETHAZINE HCL 25 MG/ML IJ SOLN
6.2500 mg | INTRAMUSCULAR | Status: DC | PRN
  Administered 2018-05-26: 6.25 mg via INTRAVENOUS
  Filled 2018-05-26: qty 1

## 2018-05-26 MED ORDER — SUGAMMADEX SODIUM 200 MG/2ML IV SOLN
INTRAVENOUS | Status: DC | PRN
  Administered 2018-05-26: 100 mg via INTRAVENOUS
  Administered 2018-05-26: 200 mg via INTRAVENOUS

## 2018-05-26 MED ORDER — MEPERIDINE HCL 50 MG/ML IJ SOLN: 6.2500 mg | INTRAMUSCULAR | Status: DC | PRN

## 2018-05-26 MED ORDER — PROPOFOL 10 MG/ML IV BOLUS
INTRAVENOUS | Status: AC
  Filled 2018-05-26: qty 20

## 2018-05-26 MED ORDER — ONDANSETRON HCL 4 MG/2ML IJ SOLN
INTRAMUSCULAR | Status: AC
  Filled 2018-05-26: qty 2

## 2018-05-26 MED ORDER — HEMOSTATIC AGENTS (NO CHARGE) OPTIME
TOPICAL | Status: DC | PRN
  Administered 2018-05-26: 1 via TOPICAL

## 2018-05-26 MED ORDER — SODIUM CHLORIDE 0.9 % IV SOLN
2.0000 g | INTRAVENOUS | Status: AC
  Administered 2018-05-26: 2 g via INTRAVENOUS

## 2018-05-26 MED ORDER — 0.9 % SODIUM CHLORIDE (POUR BTL) OPTIME
TOPICAL | Status: DC | PRN
  Administered 2018-05-26: 1000 mL

## 2018-05-26 MED ORDER — MIDAZOLAM HCL 5 MG/5ML IJ SOLN
INTRAMUSCULAR | Status: DC | PRN
  Administered 2018-05-26: 2 mg via INTRAVENOUS

## 2018-05-26 MED ORDER — CHLORHEXIDINE GLUCONATE CLOTH 2 % EX PADS: 6.0000 | MEDICATED_PAD | Freq: Once | CUTANEOUS | Status: DC

## 2018-05-26 MED ORDER — PHENYLEPHRINE HCL 10 MG/ML IJ SOLN
INTRAMUSCULAR | Status: DC | PRN
  Administered 2018-05-26: 25 ug via INTRAVENOUS

## 2018-05-26 MED ORDER — PROPOFOL 10 MG/ML IV BOLUS
INTRAVENOUS | Status: DC | PRN
  Administered 2018-05-26: 160 mg via INTRAVENOUS

## 2018-05-26 MED ORDER — SUGAMMADEX SODIUM 200 MG/2ML IV SOLN
INTRAVENOUS | Status: AC
  Filled 2018-05-26: qty 2

## 2018-05-26 MED ORDER — SODIUM CHLORIDE 0.9 % IV SOLN
INTRAVENOUS | Status: AC
  Filled 2018-05-26: qty 2

## 2018-05-26 MED ORDER — SUCCINYLCHOLINE CHLORIDE 200 MG/10ML IV SOSY
PREFILLED_SYRINGE | INTRAVENOUS | Status: AC
  Filled 2018-05-26: qty 10

## 2018-05-26 MED ORDER — FENTANYL CITRATE (PF) 250 MCG/5ML IJ SOLN
INTRAMUSCULAR | Status: AC
  Filled 2018-05-26: qty 5

## 2018-05-26 MED ORDER — ONDANSETRON HCL 4 MG/2ML IJ SOLN
INTRAMUSCULAR | Status: DC | PRN
  Administered 2018-05-26: 4 mg via INTRAVENOUS

## 2018-05-26 MED ORDER — BUPIVACAINE HCL (PF) 0.5 % IJ SOLN
INTRAMUSCULAR | Status: DC | PRN
  Administered 2018-05-26: 10 mL

## 2018-05-26 MED ORDER — DOCUSATE SODIUM 100 MG PO CAPS: 100.0000 mg | ORAL_CAPSULE | Freq: Two times a day (BID) | ORAL | 2 refills | Status: DC

## 2018-05-26 MED ORDER — LACTATED RINGERS IV SOLN: INTRAVENOUS | Status: DC

## 2018-05-26 MED ORDER — FENTANYL CITRATE (PF) 100 MCG/2ML IJ SOLN
INTRAMUSCULAR | Status: DC | PRN
  Administered 2018-05-26: 50 ug via INTRAVENOUS
  Administered 2018-05-26: 25 ug via INTRAVENOUS
  Administered 2018-05-26: 50 ug via INTRAVENOUS
  Administered 2018-05-26: 75 ug via INTRAVENOUS
  Administered 2018-05-26: 50 ug via INTRAVENOUS

## 2018-05-26 MED ORDER — MIDAZOLAM HCL 2 MG/2ML IJ SOLN
INTRAMUSCULAR | Status: AC
  Filled 2018-05-26: qty 2

## 2018-05-26 MED ORDER — OXYCODONE HCL 5 MG PO TABS: 5.0000 mg | ORAL_TABLET | ORAL | 0 refills | Status: DC | PRN

## 2018-05-26 MED ORDER — BUPIVACAINE HCL (PF) 0.5 % IJ SOLN
INTRAMUSCULAR | Status: AC
  Filled 2018-05-26: qty 30

## 2018-05-26 MED ORDER — ROCURONIUM BROMIDE 100 MG/10ML IV SOLN
INTRAVENOUS | Status: DC | PRN
  Administered 2018-05-26: 10 mg via INTRAVENOUS
  Administered 2018-05-26: 30 mg via INTRAVENOUS

## 2018-05-26 MED ORDER — HYDROCODONE-ACETAMINOPHEN 7.5-325 MG PO TABS
1.0000 | ORAL_TABLET | Freq: Once | ORAL | Status: AC | PRN
  Administered 2018-05-26: 1 via ORAL
  Filled 2018-05-26: qty 1

## 2018-05-26 MED ORDER — HYDROMORPHONE HCL 1 MG/ML IJ SOLN
0.2500 mg | INTRAMUSCULAR | Status: DC | PRN
  Administered 2018-05-26 (×3): 0.5 mg via INTRAVENOUS
  Filled 2018-05-26 (×3): qty 0.5

## 2018-05-26 MED ORDER — LIDOCAINE HCL 1 % IJ SOLN
INTRAMUSCULAR | Status: DC | PRN
  Administered 2018-05-26: 60 mg via INTRADERMAL

## 2018-05-26 MED ORDER — ONDANSETRON HCL 4 MG/2ML IJ SOLN
4.0000 mg | Freq: Once | INTRAMUSCULAR | Status: AC
  Administered 2018-05-26: 4 mg via INTRAVENOUS

## 2018-05-26 MED ORDER — ROCURONIUM BROMIDE 10 MG/ML (PF) SYRINGE
PREFILLED_SYRINGE | INTRAVENOUS | Status: AC
  Filled 2018-05-26: qty 10

## 2018-05-26 MED ORDER — SUCCINYLCHOLINE CHLORIDE 20 MG/ML IJ SOLN
INTRAMUSCULAR | Status: DC | PRN
  Administered 2018-05-26: 140 mg via INTRAVENOUS

## 2018-05-26 MED ORDER — LACTATED RINGERS IV SOLN
INTRAVENOUS | Status: DC
  Administered 2018-05-26: 10:00:00 via INTRAVENOUS

## 2018-05-26 SURGICAL SUPPLY — 44 items
ADH SKN CLS APL DERMABOND .7 (GAUZE/BANDAGES/DRESSINGS) ×1
APPLIER CLIP ROT 10 11.4 M/L (STAPLE) ×2
BAG RETRIEVAL 10 (BASKET) ×1
BLADE SURG 15 STRL LF DISP TIS (BLADE) ×1 IMPLANT
BLADE SURG 15 STRL SS (BLADE) ×1
CHLORAPREP W/TINT 26ML (MISCELLANEOUS) ×2 IMPLANT
CLIP APPLIE ROT 10 11.4 M/L (STAPLE) ×1 IMPLANT
CLOTH BEACON ORANGE TIMEOUT ST (SAFETY) ×2 IMPLANT
COVER LIGHT HANDLE STERIS (MISCELLANEOUS) ×4 IMPLANT
DECANTER SPIKE VIAL GLASS SM (MISCELLANEOUS) ×2 IMPLANT
DERMABOND ADVANCED (GAUZE/BANDAGES/DRESSINGS) ×1
DERMABOND ADVANCED .7 DNX12 (GAUZE/BANDAGES/DRESSINGS) ×1 IMPLANT
ELECT REM PT RETURN 9FT ADLT (ELECTROSURGICAL) ×2
ELECTRODE REM PT RTRN 9FT ADLT (ELECTROSURGICAL) ×1 IMPLANT
FILTER SMOKE EVAC LAPAROSHD (FILTER) ×2 IMPLANT
GLOVE BIO SURGEON STRL SZ 6.5 (GLOVE) ×2 IMPLANT
GLOVE BIO SURGEON STRL SZ7 (GLOVE) ×4 IMPLANT
GLOVE BIOGEL PI IND STRL 6.5 (GLOVE) ×1 IMPLANT
GLOVE BIOGEL PI IND STRL 7.0 (GLOVE) ×3 IMPLANT
GLOVE BIOGEL PI INDICATOR 6.5 (GLOVE) ×1
GLOVE BIOGEL PI INDICATOR 7.0 (GLOVE) ×3
GOWN STRL REUS W/TWL LRG LVL3 (GOWN DISPOSABLE) ×6 IMPLANT
HEMOSTAT SNOW SURGICEL 2X4 (HEMOSTASIS) ×2 IMPLANT
INST SET LAPROSCOPIC AP (KITS) ×2 IMPLANT
IV NS IRRIG 3000ML ARTHROMATIC (IV SOLUTION) IMPLANT
KIT TURNOVER KIT A (KITS) ×2 IMPLANT
MANIFOLD NEPTUNE II (INSTRUMENTS) ×2 IMPLANT
NEEDLE INSUFFLATION 14GA 120MM (NEEDLE) ×2 IMPLANT
NS IRRIG 1000ML POUR BTL (IV SOLUTION) ×2 IMPLANT
PACK LAP CHOLE LZT030E (CUSTOM PROCEDURE TRAY) ×2 IMPLANT
PAD ARMBOARD 7.5X6 YLW CONV (MISCELLANEOUS) ×2 IMPLANT
SET BASIN LINEN APH (SET/KITS/TRAYS/PACK) ×2 IMPLANT
SET TUBE IRRIG SUCTION NO TIP (IRRIGATION / IRRIGATOR) IMPLANT
SLEEVE ENDOPATH XCEL 5M (ENDOMECHANICALS) ×2 IMPLANT
SUT MNCRL AB 4-0 PS2 18 (SUTURE) ×4 IMPLANT
SUT VICRYL 0 UR6 27IN ABS (SUTURE) ×2 IMPLANT
SYS BAG RETRIEVAL 10MM (BASKET) ×1
SYSTEM BAG RETRIEVAL 10MM (BASKET) ×1 IMPLANT
TROCAR ENDO BLADELESS 11MM (ENDOMECHANICALS) ×2 IMPLANT
TROCAR XCEL NON-BLD 5MMX100MML (ENDOMECHANICALS) ×2 IMPLANT
TROCAR XCEL UNIV SLVE 11M 100M (ENDOMECHANICALS) ×2 IMPLANT
TUBE CONNECTING 12X1/4 (SUCTIONS) ×2 IMPLANT
TUBING INSUFFLATION (TUBING) ×2 IMPLANT
WARMER LAPAROSCOPE (MISCELLANEOUS) ×2 IMPLANT

## 2018-05-26 NOTE — Anesthesia Postprocedure Evaluation (Signed)
Anesthesia Post Note  Patient: [name]  Procedure(s) Performed: LAPAROSCOPIC CHOLECYSTECTOMY (N/A Abdomen)  Patient location during evaluation: PACU Anesthesia Type: General Level of consciousness: awake and patient cooperative Pain management: pain level controlled Vital Signs Assessment: post-procedure vital signs reviewed and stable Respiratory status: spontaneous breathing, nonlabored ventilation and respiratory function stable Cardiovascular status: blood pressure returned to baseline Postop Assessment: no apparent nausea or vomiting Anesthetic complications: no     Last Vitals:  Vitals:   05/26/18 1115 05/26/18 1130  BP: 137/76 (!) 151/79  Pulse: 67 (!) 58  Resp: 15 15  Temp:    SpO2: 100% 100%    Last Pain:  Vitals:   05/26/18 1130  TempSrc:   PainSc: 0-No pain                 [name]

## 2018-05-26 NOTE — Discharge Instructions (Signed)
General Anesthesia, Adult, Care After These instructions provide you with information about caring for yourself after your procedure. Your health care provider may also give you more specific instructions. Your treatment has been planned according to current medical practices, but problems sometimes occur. Call your health care provider if you have any problems or questions after your procedure. What can I expect after the procedure? After the procedure, it is common to have:  Vomiting.  A sore throat.  Mental slowness.  It is common to feel:  Nauseous.  Cold or shivery.  Sleepy.  Tired.  Sore or achy, even in parts of your body where you did not have surgery.  Follow these instructions at home: For at least 24 hours after the procedure:  Do not: ? Participate in activities where you could fall or become injured. ? Drive. ? Use heavy machinery. ? Drink alcohol. ? Take sleeping pills or medicines that cause drowsiness. ? Make important decisions or sign legal documents. ? Take care of children on your own.  Rest. Eating and drinking  If you vomit, drink water, juice, or soup when you can drink without vomiting.  Drink enough fluid to keep your urine clear or pale yellow.  Make sure you have little or no nausea before eating solid foods.  Follow the diet recommended by your health care provider. General instructions  Have a responsible adult stay with you until you are awake and alert.  Return to your normal activities as told by your health care provider. Ask your health care provider what activities are safe for you.  Take over-the-counter and prescription medicines only as told by your health care provider.  If you smoke, do not smoke without supervision.  Keep all follow-up visits as told by your health care provider. This is important. Contact a health care provider if:  You continue to have nausea or vomiting at home, and medicines are not helpful.  You  cannot drink fluids or start eating again.  You cannot urinate after 8-12 hours.  You develop a skin rash.  You have fever.  You have increasing redness at the site of your procedure. Get help right away if:  You have difficulty breathing.  You have chest pain.  You have unexpected bleeding.  You feel that you are having a life-threatening or urgent problem. This information is not intended to replace advice given to you by your health care provider. Make sure you discuss any questions you have with your health care provider. Document Released: 10/04/2000 Document Revised: 12/01/2015 Document Reviewed: 06/12/2015 Elsevier Interactive Patient Education  2018 [employer]. Discharge Instructions: Shower per your regular routine. Take tylenol and ibuprofen as needed for pain control, alternating every 4-6 hours.  Take Roxicodone for breakthrough pain. Take colace for constipation related to narcotic pain medication. Do not pick at the dermabond glue on your incision sites.    Laparoscopic Cholecystectomy, Care After This sheet gives you information about how to care for yourself after your procedure. Your doctor may also give you more specific instructions. If you have problems or questions, contact your doctor. Follow these instructions at home: Care for cuts from surgery (incisions)   Follow instructions from your doctor about how to take care of your cuts from surgery. Make sure you: ? Wash your hands with soap and water before you change your bandage (dressing). If you cannot use soap and water, use hand sanitizer. ? Change your bandage as told by your doctor. ? Leave stitches (sutures), skin  glue, or skin tape (adhesive) strips in place. They may need to stay in place for 2 weeks or longer. If tape strips get loose and curl up, you may trim the loose edges. Do not remove tape strips completely unless your doctor says it is okay.  Do not take baths, swim, or use a hot tub  until your doctor says it is okay. You may shower.  Check your surgical cut area every day for signs of infection. Check for: ? More redness, swelling, or pain. ? More fluid or blood. ? Warmth. ? Pus or a bad smell. Activity  Do not drive or use heavy machinery while taking prescription pain medicine.  Do not lift anything that is heavier than 10 lb (4.5 kg) until your doctor says it is okay.  Do not play contact sports until your doctor says it is okay.  Do not drive for 24 hours if you were given a medicine to help you relax (sedative).  Rest as needed. Do not return to work or school until your doctor says it is okay. General instructions  Take over-the-counter and prescription medicines only as told by your doctor.  To prevent or treat constipation while you are taking prescription pain medicine, your doctor may recommend that you: ? Drink enough fluid to keep your pee (urine) clear or pale yellow. ? Take over-the-counter or prescription medicines. ? Eat foods that are high in fiber, such as fresh fruits and vegetables, whole grains, and beans. ? Limit foods that are high in fat and processed sugars, such as fried and sweet foods. Contact a doctor if:  You develop a rash.  You have more redness, swelling, or pain around your surgical cuts.  You have more fluid or blood coming from your surgical cuts.  Your surgical cuts feel warm to the touch.  You have pus or a bad smell coming from your surgical cuts.  You have a fever.  One or more of your surgical cuts breaks open. Get help right away if:  You have trouble breathing.  You have chest pain.  You have pain that is getting worse in your shoulders.  You faint or feel dizzy when you stand.  You have very bad pain in your belly (abdomen).  You are sick to your stomach (nauseous) for more than one day.  You have throwing up (vomiting) that lasts for more than one day.  You have leg pain. This information is  not intended to replace advice given to you by your health care provider. Make sure you discuss any questions you have with your health care provider. Document Released: 04/06/2008 Document Revised: 01/17/2016 Document Reviewed: 12/15/2015 Elsevier Interactive Patient Education  2018 [employer].  General Anesthesia, Adult, Care After These instructions provide you with information about caring for yourself after your procedure. Your health care provider may also give you more specific instructions. Your treatment has been planned according to current medical practices, but problems sometimes occur. Call your health care provider if you have any problems or questions after your procedure. What can I expect after the procedure? After the procedure, it is common to have:  Vomiting.  A sore throat.  Mental slowness.  It is common to feel:  Nauseous.  Cold or shivery.  Sleepy.  Tired.  Sore or achy, even in parts of your body where you did not have surgery.  Follow these instructions at home: For at least 24 hours after the procedure:  Do not: ? Participate in  activities where you could fall or become injured. ? Drive. ? Use heavy machinery. ? Drink alcohol. ? Take sleeping pills or medicines that cause drowsiness. ? Make important decisions or sign legal documents. ? Take care of children on your own.  Rest. Eating and drinking  If you vomit, drink water, juice, or soup when you can drink without vomiting.  Drink enough fluid to keep your urine clear or pale yellow.  Make sure you have little or no nausea before eating solid foods.  Follow the diet recommended by your health care provider. General instructions  Have a responsible adult stay with you until you are awake and alert.  Return to your normal activities as told by your health care provider. Ask your health care provider what activities are safe for you.  Take over-the-counter and prescription medicines  only as told by your health care provider.  If you smoke, do not smoke without supervision.  Keep all follow-up visits as told by your health care provider. This is important. Contact a health care provider if:  You continue to have nausea or vomiting at home, and medicines are not helpful.  You cannot drink fluids or start eating again.  You cannot urinate after 8-12 hours.  You develop a skin rash.  You have fever.  You have increasing redness at the site of your procedure. Get help right away if:  You have difficulty breathing.  You have chest pain.  You have unexpected bleeding.  You feel that you are having a life-threatening or urgent problem. This information is not intended to replace advice given to you by your health care provider. Make sure you discuss any questions you have with your health care provider. Document Released: 10/04/2000 Document Revised: 12/01/2015 Document Reviewed: 06/12/2015 Elsevier Interactive Patient Education  [employer]2018 Elsevier Inc.

## 2018-05-26 NOTE — Anesthesia Preprocedure Evaluation (Signed)
Anesthesia Evaluation    Airway Mallampati: II       Dental  (+) Chipped   Pulmonary    breath sounds clear to auscultation       Cardiovascular  Rhythm:regular     Neuro/Psych    GI/Hepatic   Endo/Other    Renal/GU      Musculoskeletal   Abdominal   Peds  Hematology   Anesthesia Other Findings Obesity  Denies any sig PMH   Reproductive/Obstetrics                             Anesthesia Physical Anesthesia Plan  ASA: II  Anesthesia Plan: General   Post-op Pain Management:    Induction:   PONV Risk Score and Plan:   Airway Management Planned:   Additional Equipment:   Intra-op Plan:   Post-operative Plan:   Informed Consent:   Plan Discussed with: Anesthesiologist  Anesthesia Plan Comments:         Anesthesia Quick Evaluation

## 2018-05-26 NOTE — Interval H&P Note (Signed)
History and Physical Interval Note:  05/26/2018 9:39 AM  [name]  has presented today for surgery, with the diagnosis of cholelithiasis  The various methods of treatment have been discussed with the patient and family. After consideration of risks, benefits and other options for treatment, the patient has consented to  Procedure(s): LAPAROSCOPIC CHOLECYSTECTOMY (N/A) as a surgical intervention .  The patient's history has been reviewed, patient examined, no change in status, stable for surgery.  I have reviewed the patient's chart and labs.  Questions were answered to the patient's satisfaction.    No questions  Lucretia RoersLindsay C Ziyonna Christner

## 2018-05-26 NOTE — Transfer of Care (Signed)
Immediate Anesthesia Transfer of Care Note  Patient: [name]  Procedure(s) Performed: LAPAROSCOPIC CHOLECYSTECTOMY (N/A Abdomen)  Patient Location: PACU  Anesthesia Type:General  Level of Consciousness: drowsy  Airway & Oxygen Therapy: Patient Spontanous Breathing  Post-op Assessment: Report given to RN, Post -op Vital signs reviewed and stable and Patient moving all extremities  Post vital signs: Reviewed and stable  Last Vitals:  Vitals Value Taken Time  BP    Temp    Pulse    Resp    SpO2      Last Pain:  Vitals:   05/26/18 0908  TempSrc: Oral  PainSc: 0-No pain      Patients Stated Pain Goal: 8 (05/26/18 0908)  Complications: No apparent anesthesia complications

## 2018-05-26 NOTE — Anesthesia Procedure Notes (Signed)
Procedure Name: Intubation Date/Time: 05/26/2018 9:55 AM Performed by: Despina HiddenIdacavage, Adalaya Irion J, CRNA Pre-anesthesia Checklist: Patient identified, Timeout performed, Emergency Drugs available, Suction available and Patient being monitored Patient Re-evaluated:Patient Re-evaluated prior to induction Oxygen Delivery Method: Circle system utilized Preoxygenation: Pre-oxygenation with 100% oxygen Induction Type: IV induction Ventilation: Mask ventilation without difficulty Laryngoscope Size: Glidescope Grade View: Grade II Tube size: 7.0 mm Number of attempts: 2 Airway Equipment and Method: Rigid stylet and Video-laryngoscopy Placement Confirmation: ETT inserted through vocal cords under direct vision,  positive ETCO2 and breath sounds checked- equal and bilateral Secured at: 22 cm Tube secured with: Tape Dental Injury: Teeth and Oropharynx as per pre-operative assessment  Future Recommendations: Recommend- induction with short-acting agent, and alternative techniques readily available Comments: Direct laryngoscopy with poor visualization first attempt.Easy intubation with Glidescope.

## 2018-05-26 NOTE — Op Note (Signed)
Operative Note   Preoperative Diagnosis: Symptomatic cholelithiasis   Postoperative Diagnosis: Same   Procedure(s) Performed: Laparoscopic cholecystectomy   Surgeon: Lillia AbedLindsay C. Henreitta LeberBridges, MD   Assistants: No qualified resident was available   Anesthesia: General endotracheal   Anesthesiologist: Arbie Cookeyameransi, Benjamin G, MD    Specimens: Gallbladder    Estimated Blood Loss: Minimal    Blood Replacement: None    Complications: None    Operative Findings: Distended gallbladder with stones    Procedure: The patient was taken to the operating room and placed supine. General endotracheal anesthesia was induced. Intravenous antibiotics were administered per protocol. An orogastric tube positioned to decompress the stomach. The abdomen was prepared and draped in the usual sterile fashion.    A supraumbilical incision was made and a Veress technique was utilized to achieve pneumoperitoneum to 15 mmHg with carbon dioxide. A 11 mm optiview port was placed through the supraumbilical region, and a 10 mm 0-degree operative laparoscope was introduced. The area underlying the trocar and Veress needle were inspected and without evidence of injury.  Remaining trocars were placed under direct vision. Two 5 mm ports were placed in the right abdomen, between the anterior axillary and midclavicular line.  A final 11 mm port was placed through the mid-epigastrium, near the falciform ligament.    The gallbladder fundus was elevated cephalad and the infundibulum was retracted to the patient's right. The gallbladder/cystic duct junction was skeletonized. The cystic artery noted in the triangle of Calot and was also skeletonized.  We then continued liberal medial and lateral dissection until the critical view of safety was achieved.    The cystic duct and cystic artery were triply clipped and divided.  There was a posterior branch of the cystic artery that was clipped and divided. The gallbladder was then dissected  from the liver bed with electrocautery. The specimen was placed in an Endopouch and was retrieved through the epigastric site.   Final inspection revealed acceptable hemostasis. Surgicel Jamelle HaringSnow was placed in the gallbladder bed. Trocars were removed and pneumoperitoneum was released.  0 Vicryl fascial sutures was used the umbilical port site but the epigastric site was at the rib. Skin incisions were closed with 4-0 Monocryl subcuticular sutures and Dermabond. The patient was awakened from anesthesia and extubated without complication.    Algis GreenhouseLindsay Bridges, MD [facility] [address] [place], [place] [id number] [phone] (office)

## 2018-05-29 ENCOUNTER — Encounter (HOSPITAL_COMMUNITY): Payer: Self-pay | Admitting: General Surgery

## 2018-06-06 ENCOUNTER — Encounter: Payer: Self-pay | Admitting: General Surgery

## 2018-06-06 ENCOUNTER — Ambulatory Visit (INDEPENDENT_AMBULATORY_CARE_PROVIDER_SITE_OTHER): Payer: Self-pay | Admitting: General Surgery

## 2018-06-06 VITALS — BP 159/93 | HR 75 | Temp 97.5°F | Resp 18 | Wt 216.2 lb

## 2018-06-06 DIAGNOSIS — K8020 Calculus of gallbladder without cholecystitis without obstruction: Secondary | ICD-10-CM

## 2018-06-06 NOTE — Progress Notes (Signed)
[facility] Note   HPI:  47 y.o. Female presents to clinic for post-op follow-up evaluation after a laparoscopic cholecystectomy. Patient reports she has been sore and has some shortness of breath. She denies any fever or chills.  Review of Systems:  Tolerating diet but poor appetite  Normal BMs Some SOB with exertion  All other review of systems: otherwise negative   Pathology: Diagnosis Gallbladder - CHRONIC CHOLECYSTITIS WITH CHOLELITHIASIS  Vital Signs:  BP (!) 159/93 (BP Location: Left Arm, Patient Position: Sitting, Cuff Size: Large)   Pulse 75   Temp (!) 97.5 F (36.4 C) (Temporal)   Resp 18   Wt 216 lb 3.2 oz (98.1 kg)   BMI 37.11 kg/m    Physical Exam:  Physical Exam  Cardiovascular: Normal rate.  Pulmonary/Chest: Effort normal and breath sounds normal.  Abdominal: Soft. She exhibits no distension. There is no tenderness.  Port sites healing, no erythema or drainage, tender at epigastric site  Musculoskeletal:  No calf tenderness, no pain with passive dorsiflexion   Assessment:  47 y.o. yo Female s/p Lap cholecystectomy for chronic cholecystitis. Doing fair but having some continued pain and SOB. Is starting to move around better and be more active. Her work involves some lifting over 10 lbs. SOB could be from deconditioning and poor lung volume. Recommended deep breathing and coughing. Unlikely to be something like PE given outpatient procedure and type of procedure.   Plan:  - Plan for return to work 06/12/2018  - If SOB worsens or does not improve will need to get evaluated. Patient aware.  - Diet and activity as tolerated   All of the above recommendations were discussed with the patient, and all the patient's questions were answered to her expressed satisfaction.  Algis GreenhouseLindsay Janeen Watson, MD [facility] [address] [place], [place] [phone] (office)

## 2018-06-06 NOTE — Patient Instructions (Addendum)
Activity and diet as tolerated.  Back to work on 06/12/2018. No restrictions.

## 2018-09-07 DIAGNOSIS — Z13220 Encounter for screening for lipoid disorders: Secondary | ICD-10-CM | POA: Diagnosis not present

## 2018-09-07 DIAGNOSIS — R030 Elevated blood-pressure reading, without diagnosis of hypertension: Secondary | ICD-10-CM | POA: Diagnosis not present

## 2018-09-07 DIAGNOSIS — H66009 Acute suppurative otitis media without spontaneous rupture of ear drum, unspecified ear: Secondary | ICD-10-CM | POA: Diagnosis not present

## 2018-09-07 DIAGNOSIS — R5383 Other fatigue: Secondary | ICD-10-CM | POA: Diagnosis not present

## 2018-09-07 DIAGNOSIS — Z131 Encounter for screening for diabetes mellitus: Secondary | ICD-10-CM | POA: Diagnosis not present

## 2018-09-07 DIAGNOSIS — E669 Obesity, unspecified: Secondary | ICD-10-CM | POA: Diagnosis not present

## 2018-09-07 DIAGNOSIS — E559 Vitamin D deficiency, unspecified: Secondary | ICD-10-CM | POA: Diagnosis not present

## 2018-10-05 ENCOUNTER — Encounter (INDEPENDENT_AMBULATORY_CARE_PROVIDER_SITE_OTHER): Payer: Self-pay | Admitting: Internal Medicine

## 2018-10-11 ENCOUNTER — Ambulatory Visit (INDEPENDENT_AMBULATORY_CARE_PROVIDER_SITE_OTHER): Payer: BLUE CROSS/BLUE SHIELD | Admitting: Internal Medicine

## 2018-10-11 DIAGNOSIS — E559 Vitamin D deficiency, unspecified: Secondary | ICD-10-CM | POA: Diagnosis not present

## 2018-10-11 DIAGNOSIS — R7302 Impaired glucose tolerance (oral): Secondary | ICD-10-CM | POA: Diagnosis not present

## 2018-10-11 DIAGNOSIS — E669 Obesity, unspecified: Secondary | ICD-10-CM | POA: Diagnosis not present

## 2018-12-28 ENCOUNTER — Other Ambulatory Visit: Payer: BLUE CROSS/BLUE SHIELD

## 2018-12-28 ENCOUNTER — Telehealth: Payer: Self-pay | Admitting: *Deleted

## 2018-12-28 DIAGNOSIS — Z20822 Contact with and (suspected) exposure to covid-19: Secondary | ICD-10-CM

## 2018-12-28 DIAGNOSIS — R6889 Other general symptoms and signs: Secondary | ICD-10-CM | POA: Diagnosis not present

## 2018-12-28 NOTE — Telephone Encounter (Signed)
Dr Anastasio Champion is calling to request testing for patient Phone: [phone]  Exposure at work place  Attempted to call patient at number given by provider- [phone]. Left message for patient to return call for scheduling testing. Order has been placed

## 2019-01-01 ENCOUNTER — Telehealth: Payer: Self-pay | Admitting: Internal Medicine

## 2019-01-01 LAB — NOVEL CORONAVIRUS, NAA: SARS-CoV-2, NAA: NOT DETECTED

## 2019-01-01 NOTE — Telephone Encounter (Signed)
Advised patient her COVID 19 test result is negative.  °

## 2019-01-11 DIAGNOSIS — Z713 Dietary counseling and surveillance: Secondary | ICD-10-CM | POA: Diagnosis not present

## 2019-01-11 DIAGNOSIS — E559 Vitamin D deficiency, unspecified: Secondary | ICD-10-CM | POA: Diagnosis not present

## 2019-01-11 DIAGNOSIS — I10 Essential (primary) hypertension: Secondary | ICD-10-CM | POA: Diagnosis not present

## 2019-01-11 DIAGNOSIS — E669 Obesity, unspecified: Secondary | ICD-10-CM | POA: Diagnosis not present

## 2019-01-22 DIAGNOSIS — E559 Vitamin D deficiency, unspecified: Secondary | ICD-10-CM | POA: Diagnosis not present

## 2019-01-22 DIAGNOSIS — E669 Obesity, unspecified: Secondary | ICD-10-CM | POA: Diagnosis not present

## 2019-01-22 DIAGNOSIS — I10 Essential (primary) hypertension: Secondary | ICD-10-CM | POA: Diagnosis not present

## 2019-01-24 DIAGNOSIS — I10 Essential (primary) hypertension: Secondary | ICD-10-CM | POA: Diagnosis not present

## 2019-02-14 DIAGNOSIS — E559 Vitamin D deficiency, unspecified: Secondary | ICD-10-CM | POA: Diagnosis not present

## 2019-02-14 DIAGNOSIS — I10 Essential (primary) hypertension: Secondary | ICD-10-CM | POA: Diagnosis not present

## 2019-03-08 DIAGNOSIS — E559 Vitamin D deficiency, unspecified: Secondary | ICD-10-CM | POA: Diagnosis not present

## 2019-03-08 DIAGNOSIS — I10 Essential (primary) hypertension: Secondary | ICD-10-CM | POA: Diagnosis not present

## 2019-03-08 DIAGNOSIS — E669 Obesity, unspecified: Secondary | ICD-10-CM | POA: Diagnosis not present

## 2019-04-12 ENCOUNTER — Telehealth (INDEPENDENT_AMBULATORY_CARE_PROVIDER_SITE_OTHER): Payer: Self-pay

## 2019-04-12 ENCOUNTER — Other Ambulatory Visit (INDEPENDENT_AMBULATORY_CARE_PROVIDER_SITE_OTHER): Payer: Self-pay | Admitting: Internal Medicine

## 2019-04-12 DIAGNOSIS — I10 Essential (primary) hypertension: Secondary | ICD-10-CM

## 2019-04-12 MED ORDER — HYDROCHLOROTHIAZIDE 25 MG PO TABS: 25.0000 mg | ORAL_TABLET | Freq: Every day | ORAL | 0 refills | Status: DC

## 2019-04-12 MED ORDER — AMLODIPINE BESYLATE 5 MG PO TABS: 5.0000 mg | ORAL_TABLET | Freq: Every day | ORAL | 0 refills | Status: DC

## 2019-04-13 MED ORDER — AMLODIPINE BESYLATE 5 MG PO TABS: 5.0000 mg | ORAL_TABLET | Freq: Every day | ORAL | 3 refills | Status: DC

## 2019-04-13 NOTE — Telephone Encounter (Signed)
I did send this yesterday, do not know what happened.

## 2019-05-17 ENCOUNTER — Ambulatory Visit (INDEPENDENT_AMBULATORY_CARE_PROVIDER_SITE_OTHER): Payer: BC Managed Care – PPO | Admitting: Internal Medicine

## 2019-06-20 ENCOUNTER — Telehealth (INDEPENDENT_AMBULATORY_CARE_PROVIDER_SITE_OTHER): Payer: BC Managed Care – PPO | Admitting: Nurse Practitioner

## 2019-06-20 VITALS — BP 138/83

## 2019-06-20 DIAGNOSIS — G44209 Tension-type headache, unspecified, not intractable: Secondary | ICD-10-CM | POA: Diagnosis not present

## 2019-06-20 DIAGNOSIS — I10 Essential (primary) hypertension: Secondary | ICD-10-CM | POA: Diagnosis not present

## 2019-06-20 DIAGNOSIS — E559 Vitamin D deficiency, unspecified: Secondary | ICD-10-CM | POA: Diagnosis not present

## 2019-06-20 NOTE — Addendum Note (Signed)
Addended by: Ailene Ards on: 06/20/2019 03:57 PM   Modules accepted: Orders

## 2019-06-20 NOTE — Patient Instructions (Signed)
Thank you for choosing [facility] as your medical provider! If you have any questions or concerns regarding your health care, please do not hesitate to call our office.  Continue all medications as prescribed.  We will see you in a couple weeks for blood work, at which point we may need to consider medication adjustments to better control your blood pressure.  Remember your goal is for blood pressure to be less than 130 over less than 80.  Continue utilizing Aleve as well as practicing deep breathing exercises and neck stretches as we discussed over the phone.  If your headaches persist we may need to consider referral to neurology please let us know if you like this referral.  Please follow-up as scheduled in 2 weeks. We look forward to seeing you again soon! Have a great holiday!!  At [facility] we value your feedback. You may receive a survey about your visit today. Please share your experience as we strive to create trusting relationships with our patients to provide genuine, compassionate, quality care.  We appreciate your understanding and patience as we review any laboratory studies, imaging, and other diagnostic tests that are ordered as we care for you. We do our best to address any and all results in a timely manner. If you do not hear about test results within 1 week, please do not hesitate to contact us. If we referred you to a specialist during your visit or ordered imaging testing, contact the office if you have not been contacted to be scheduled within 1 weeks.  We also encourage the use of MyChart, which contains your medical information for your review as well. If you are not enrolled in this feature, an access code is on this after visit summary for your convenience. Thank you for allowing [name] to be involved in your care.

## 2019-06-20 NOTE — Progress Notes (Signed)
Due to national recommendations of social distancing related to the COVID19 pandemic, an audio/visual tele-health visit was felt to be the most appropriate encounter type for this patient today. I connected with  [name] on 06/20/19 utilizing audio-only technology and verified that I am speaking with the correct person using two identifiers. The patient was located at their place of employment, and I was located at the office of [facility] during the encounter.  The patient did not have access to technology that included the ability to use a video at the time of the appointment.  Thus, audio only technology was utilized.  I discussed the limitations of evaluation and management by telemedicine. The patient expressed understanding and agreed to proceed.     Subjective:  Patient ID: [name], female    DOB: April 29, 1971  Age: 48 y.o. MRN: [id number]  CC:  Chief Complaint  Patient presents with  . Follow-up      HPI  This patient presents for a virtual visit for follow-up of her chronic conditions including hypertension and vitamin D deficiency.  She also mentioned to me that she is having bothersome headaches over the last 2 months.  Hypertension: At her last office visit her medications were changed to amlodipine 5 mg daily as well as hydrochlorothiazide 25 mg daily.  She tells me she is tolerating these medications well but she does have some brief episodes of dizziness here and there.  Vitamin D deficiency: She is now taking 10,000 IUs of vitamin D3 daily.  Last serum level was collected in July 2020 and showed a level of 43.  At that time I believe she was on 5000 IUs of vitamin D3 daily.  She also mentions to me that she has been having a daily headache for approximately 2 months.  She tells me sometimes it is severe enough that it wakes her up from sleep.  If she takes to relieve the headache is resolved.  She rates the headache as a 6-7 out of 10 in  severity.  It is characterized as a throbbing headache.  She tells me it feels like a tight band wrapping around her head.  She denies any other neurological symptoms other than mild dizziness at times, but has no sensory changes, weakness, severe visual abnormalities.  She does note that she has blurred vision intermittently but feels this is due to needing a new prescription of eyeglass lenses.  Past Medical History:  Diagnosis Date  . Vertigo       Family History  Problem Relation Age of Onset  . Hypertension Mother   . Diabetes Mother     Social History   Social History Narrative  . Not on file   Social History   Tobacco Use  . Smoking status: Never Smoker  . Smokeless tobacco: Never Used  Substance Use Topics  . Alcohol use: No     No outpatient medications have been marked as taking for the 06/20/19 encounter (Telemedicine) with Elenore Paddy, NP.    ROS:  Review of Systems  Constitutional: Negative for chills, fever, malaise/fatigue and weight loss.  Eyes: Positive for blurred vision (Blurred vision worse with headache).  Respiratory: Negative for cough, shortness of breath and wheezing.   Cardiovascular: Positive for palpitations. Negative for chest pain.  Neurological: Positive for dizziness and headaches (6-7/10; aleve resolves the headache; throbbing). Negative for sensory change and weakness.  Psychiatric/Behavioral: Negative for suicidal ideas. The patient is nervous/anxious.  Objective:   Today's Vitals: BP 138/83  Vitals with BMI 06/20/2019 06/06/2018 05/26/2018  Height - - -  Weight - 216 lbs 3 oz -  BMI - 50.56 -  Systolic 979 480 165  Diastolic 83 93 59  Pulse - 75 66     Physical Exam Comprehensive physical exam not conducted today as office visit was conducted remotely.  Blood pressure was collected using equipment available to her either at home or at her place of employment.  She did sound well over the phone.  She answered questions  appropriately and was alert and oriented.      Assessment   No diagnosis found.    Tests ordered No orders of the defined types were placed in this encounter.    Plan: Please see assessment and plan per problem list below.   No orders of the defined types were placed in this encounter.   Patient to follow-up in 2 weeks for blood work and office visit follow-up shortly after.  The telephone visit for this encounter lasted for 15 minutes.  Ailene Ards, NP

## 2019-06-20 NOTE — Assessment & Plan Note (Signed)
She will come to the office in approximately 2 weeks for blood work.  At which time we will collect a basic metabolic panel to evaluate her renal function and electrolyte levels since initiating on hydrochlorothiazide therapy.  Blood pressure is much improved compared to this past July (July 2020).  At that time it was 180/110.  However, she is still above goal which is to be less than 130 over less than 80.  May need to consider increasing her hydrochlorothiazide to 50 mg daily or initiating low-dose beta-blocker to reach goal.  Will await blood work results and determine next steps for medication management based on those.

## 2019-06-20 NOTE — Assessment & Plan Note (Signed)
She will continue her current dose of vitamin D3 supplementation.  We will collect serum levels of vitamin D when she comes in for blood work in a couple of weeks.  Further recommendations may be made based upon those results.

## 2019-06-20 NOTE — Assessment & Plan Note (Addendum)
I think her headache is most likely a tension headache she did mention that she has been under a lot of stress due to work and life changes associated to stay at home recommendations and social distancing recommendations secondary to the coronavirus pandemic.  I encouraged her to continue using Aleve as needed but to also practice deep breathing exercises and neck stretches on a nightly basis.  I told her if her symptoms worsen or they persist for another month despite using over-the-counter medications, deep breathing practices, and neck stretches that we may consider referral to a neurologist to determine if there may be any other underlying causes to her pain.  She is agreeable to trying the deep breathing and neck stretches in addition to her Aleve prior to referral.  She tells me she will let us know if she would like a referral.

## 2019-07-02 ENCOUNTER — Other Ambulatory Visit: Payer: BC Managed Care – PPO

## 2019-07-09 ENCOUNTER — Other Ambulatory Visit (INDEPENDENT_AMBULATORY_CARE_PROVIDER_SITE_OTHER): Payer: BC Managed Care – PPO

## 2019-07-11 ENCOUNTER — Ambulatory Visit (INDEPENDENT_AMBULATORY_CARE_PROVIDER_SITE_OTHER): Payer: BC Managed Care – PPO | Admitting: Internal Medicine

## 2019-07-16 ENCOUNTER — Other Ambulatory Visit (INDEPENDENT_AMBULATORY_CARE_PROVIDER_SITE_OTHER): Payer: BC Managed Care – PPO

## 2019-07-16 ENCOUNTER — Other Ambulatory Visit: Payer: Self-pay

## 2019-07-16 ENCOUNTER — Other Ambulatory Visit (INDEPENDENT_AMBULATORY_CARE_PROVIDER_SITE_OTHER): Payer: Self-pay | Admitting: Nurse Practitioner

## 2019-07-16 DIAGNOSIS — I10 Essential (primary) hypertension: Secondary | ICD-10-CM

## 2019-07-16 DIAGNOSIS — E559 Vitamin D deficiency, unspecified: Secondary | ICD-10-CM | POA: Diagnosis not present

## 2019-07-17 LAB — BASIC METABOLIC PANEL
BUN: 10 mg/dL (ref 7–25)
CO2: 24 mmol/L (ref 20–32)
Calcium: 9.1 mg/dL (ref 8.6–10.2)
Chloride: 102 mmol/L (ref 98–110)
Creat: 0.89 mg/dL (ref 0.50–1.10)
Glucose, Bld: 106 mg/dL — ABNORMAL HIGH (ref 65–99)
Potassium: 3.9 mmol/L (ref 3.5–5.3)
Sodium: 140 mmol/L (ref 135–146)

## 2019-07-17 LAB — VITAMIN D 25 HYDROXY (VIT D DEFICIENCY, FRACTURES): Vit D, 25-Hydroxy: 44 ng/mL (ref 30–100)

## 2019-07-17 LAB — EXTRA LAV TOP TUBE

## 2019-07-23 ENCOUNTER — Ambulatory Visit (INDEPENDENT_AMBULATORY_CARE_PROVIDER_SITE_OTHER): Payer: BC Managed Care – PPO | Admitting: Internal Medicine

## 2019-07-23 ENCOUNTER — Encounter (INDEPENDENT_AMBULATORY_CARE_PROVIDER_SITE_OTHER): Payer: Self-pay | Admitting: Internal Medicine

## 2019-07-23 VITALS — BP 128/78

## 2019-07-23 DIAGNOSIS — I10 Essential (primary) hypertension: Secondary | ICD-10-CM | POA: Diagnosis not present

## 2019-07-23 DIAGNOSIS — E559 Vitamin D deficiency, unspecified: Secondary | ICD-10-CM

## 2019-07-23 NOTE — Progress Notes (Signed)
Metrics: Intervention Frequency ACO  Documented Smoking Status Yearly  Screened one or more times in 24 months  Cessation Counseling or  Active cessation medication Past 24 months  Past 24 months   Guideline developer: UpToDate (See UpToDate for funding source) Date Released: 2014       Wellness Office Visit  Subjective:  Patient ID: [name], female    DOB: 07-Oct-1970  Age: 49 y.o. MRN: [id number]  CC: This is an audio telemedicine visit with the patient who is at home and I am in my office.  I was able to easily recognize her voice. This visit is to follow-up on hypertension. HPI  She was getting headaches and she stopped hydrochlorothiazide at which point the headaches resolved.  She continues to only take amlodipine 5 mg daily and she seems to be doing well with this. She is following dietary recommendations with intermittent fasting that we had spoken of before.  She feels that she might be losing weight. She denies any chest pain, dyspnea or palpitations Past Medical History:  Diagnosis Date  . Vertigo       Family History  Problem Relation Age of Onset  . Hypertension Mother   . Diabetes Mother     Social History   Social History Narrative  . Not on file   Social History   Tobacco Use  . Smoking status: Never Smoker  . Smokeless tobacco: Never Used  Substance Use Topics  . Alcohol use: No    Current Meds  Medication Sig  . amLODipine (NORVASC) 5 MG tablet Take 1 tablet (5 mg total) by mouth daily.  . Cholecalciferol 1.25 MG (50000 UT) TABS Take 2 tablets by mouth.     Objective:   Today's Vitals: BP 128/78  Vitals with BMI 07/23/2019 06/20/2019 06/06/2018  Height - - -  Weight - - 216 lbs 3 oz  BMI - - 37.09  Systolic 128 138 119  Diastolic 78 83 93  Pulse - - 75     Physical Exam  She had checked her blood pressure reading today and they are documented above, in a good range.  She appears to be alert and orientated on the  phone.     Assessment   1. Essential hypertension   2. Vitamin D deficiency       Tests ordered No orders of the defined types were placed in this encounter.    Plan: 1. I reviewed most recent blood work with her which showed suboptimal vitamin D levels and I have told to increase the vitamin D3 to 10,000 units daily. 2. Her electrolyte/renal function is normal and she will continue with taking amlodipine 5 mg daily for her hypertension. 3. I will see her in about 3 months time for an annual physical exam.   No orders of the defined types were placed in this encounter.   Wilson Singer, MD

## 2019-10-08 ENCOUNTER — Encounter (INDEPENDENT_AMBULATORY_CARE_PROVIDER_SITE_OTHER): Payer: Self-pay | Admitting: Internal Medicine

## 2019-10-08 ENCOUNTER — Ambulatory Visit (INDEPENDENT_AMBULATORY_CARE_PROVIDER_SITE_OTHER): Payer: BC Managed Care – PPO | Admitting: Internal Medicine

## 2019-10-08 ENCOUNTER — Other Ambulatory Visit: Payer: Self-pay

## 2019-10-08 VITALS — BP 120/72 | HR 88 | Temp 99.1°F | Resp 18 | Ht 64.0 in | Wt 233.0 lb

## 2019-10-08 DIAGNOSIS — M25562 Pain in left knee: Secondary | ICD-10-CM | POA: Diagnosis not present

## 2019-10-08 MED ORDER — PREDNISONE 20 MG PO TABS: 40.0000 mg | ORAL_TABLET | Freq: Every day | ORAL | 1 refills | Status: DC

## 2019-10-08 NOTE — Progress Notes (Signed)
Metrics: Intervention Frequency ACO  Documented Smoking Status Yearly  Screened one or more times in 24 months  Cessation Counseling or  Active cessation medication Past 24 months  Past 24 months   Guideline developer: UpToDate (See UpToDate for funding source) Date Released: 2014       Wellness Office Visit  Subjective:  Patient ID: [name], female    DOB: [birth year]  Age: 49 y.o. MRN: [id number]  CC: Left knee pain HPI This lady comes in with a 6-week history of the above symptoms.  She denies any fall but she thinks that she might of injured her left knee when she came down from her husband's truck.  More recently in the last week or 2, it has been hurting more.  She is not quite sure if the knee is more swollen or not.  Walking seems to make the pain worse.  She is unable to flex it completely.  Past Medical History:  Diagnosis Date  . Vertigo       Family History  Problem Relation Age of Onset  . Hypertension Mother   . Diabetes Mother     Social History   Social History Narrative  . Not on file   Social History   Tobacco Use  . Smoking status: Never Smoker  . Smokeless tobacco: Never Used  Substance Use Topics  . Alcohol use: No    Current Meds  Medication Sig  . amLODipine (NORVASC) 5 MG tablet Take 1 tablet (5 mg total) by mouth daily.  . Cholecalciferol 1.25 MG (50000 UT) TABS Take 2 tablets by mouth.       Objective:   Today's Vitals: BP 120/72 (BP Location: Right Arm, Patient Position: Sitting, Cuff Size: Normal)   Pulse 88   Temp 99.1 F (37.3 C) (Temporal)   Resp 18   Ht 5\' 4"  (1.626 m)   Wt 233 lb (105.7 kg)   SpO2 98%   BMI 39.99 kg/m  Vitals with BMI 10/08/2019 07/23/2019 06/20/2019  Height 5\' 4"  (No Data) -  Weight 233 lbs (No Data) -  BMI 39.97 - -  Systolic 120 128 14/03/2019  Diastolic 72 78 83  Pulse 88 - -     Physical Exam She looks systemically well.  She does not appear to be in any acute pain.  Examination of the  left knee does not really show any significant swelling and I cannot feel any tenderness around the knee.  However, she is unable to flex the knee fully without pain.      Assessment   1. Acute pain of left knee       Tests ordered Orders Placed This Encounter  Procedures  . Ambulatory referral to Orthopedic Surgery     Plan: 1. I am not quite worried about a fracture but I am concerned about cartilage problems.  The prognosis is not  certain.  I will send her to orthopedics as soon as possible for further evaluation. 2. In the meantime, I have sent a prescription for prednisone for symptomatic relief and I have described to the patient possible side effects.   Meds ordered this encounter  Medications  . predniSONE (DELTASONE) 20 MG tablet    Sig: Take 2 tablets (40 mg total) by mouth daily with breakfast.    Dispense:  10 tablet    Refill:  1    Kamdyn Colborn , MD

## 2019-10-19 ENCOUNTER — Other Ambulatory Visit: Payer: Self-pay

## 2019-10-19 ENCOUNTER — Ambulatory Visit: Payer: BC Managed Care – PPO

## 2019-10-19 ENCOUNTER — Ambulatory Visit: Payer: BC Managed Care – PPO | Admitting: Orthopedic Surgery

## 2019-10-19 VITALS — BP 126/80 | HR 69 | Temp 97.6°F | Ht 64.0 in | Wt 227.0 lb

## 2019-10-19 DIAGNOSIS — M25562 Pain in left knee: Secondary | ICD-10-CM

## 2019-10-19 DIAGNOSIS — G8929 Other chronic pain: Secondary | ICD-10-CM

## 2019-10-19 DIAGNOSIS — M1710 Unilateral primary osteoarthritis, unspecified knee: Secondary | ICD-10-CM | POA: Diagnosis not present

## 2019-10-19 MED ORDER — MELOXICAM 7.5 MG PO TABS: 7.5000 mg | ORAL_TABLET | Freq: Every day | ORAL | 5 refills | Status: DC

## 2019-10-19 NOTE — Patient Instructions (Addendum)
You have knee arthritis   Exercise  Weight loss  nsaids you have a new prescription    Osteoarthritis  Osteoarthritis is a type of arthritis that affects tissue that covers the ends of bones in joints (cartilage). Cartilage acts as a cushion between the bones and helps them move smoothly. Osteoarthritis results when cartilage in the joints gets worn down. Osteoarthritis is sometimes called "wear and tear" arthritis. Osteoarthritis is the most common form of arthritis. It often occurs in older people. It is a condition that gets worse over time (a progressive condition). Joints that are most often affected by this condition are in:  Fingers.  Toes.  Hips.  Knees.  Spine, including neck and lower back. What are the causes? This condition is caused by age-related wearing down of cartilage that covers the ends of bones. What increases the risk? The following factors may make you more likely to develop this condition:  Older age.  Being overweight or obese.  Overuse of joints, such as in athletes.  Past injury of a joint.  Past surgery on a joint.  Family history of osteoarthritis. What are the signs or symptoms? The main symptoms of this condition are pain, swelling, and stiffness in the joint. The joint may lose its shape over time. Small pieces of bone or cartilage may break off and float inside of the joint, which may cause more pain and damage to the joint. Small deposits of bone (osteophytes) may grow on the edges of the joint. Other symptoms may include:  A grating or scraping feeling inside the joint when you move it.  Popping or creaking sounds when you move. Symptoms may affect one or more joints. Osteoarthritis in a major joint, such as your knee or hip, can make it painful to walk or exercise. If you have osteoarthritis in your hands, you might not be able to grip items, twist your hand, or control small movements of your hands and fingers (fine motor skills). How  is this diagnosed? This condition may be diagnosed based on:  Your medical history.  A physical exam.  Your symptoms.  X-rays of the affected joint(s).  Blood tests to rule out other types of arthritis. How is this treated? There is no cure for this condition, but treatment can help to control pain and improve joint function. Treatment plans may include:  A prescribed exercise program that allows for rest and joint relief. You may work with a physical therapist.  A weight control plan.  Pain relief techniques, such as: ? Applying heat and cold to the joint. ? Electric pulses delivered to nerve endings under the skin (transcutaneous electrical nerve stimulation, or TENS). ? Massage. ? Certain nutritional supplements.  NSAIDs or prescription medicines to help relieve pain.  Medicine to help relieve pain and inflammation (corticosteroids). This can be given by mouth (orally) or as an injection.  Assistive devices, such as a brace, wrap, splint, specialized glove, or cane.  Surgery, such as: ? An osteotomy. This is done to reposition the bones and relieve pain or to remove loose pieces of bone and cartilage. ? Joint replacement surgery. You may need this surgery if you have very bad (advanced) osteoarthritis. Follow these instructions at home: Activity  Rest your affected joints as directed by your health care provider.  Do not drive or use heavy machinery while taking prescription pain medicine.  Exercise as directed. Your health care provider or physical therapist may recommend specific types of exercise, such as: ? Strengthening  exercises. These are done to strengthen the muscles that support joints that are affected by arthritis. They can be performed with weights or with exercise bands to add resistance. ? Aerobic activities. These are exercises, such as brisk walking or water aerobics, that get your heart pumping. ? Range-of-motion activities. These keep your joints easy  to move. ? Balance and agility exercises. Managing pain, stiffness, and swelling      If directed, apply heat to the affected area as often as told by your health care provider. Use the heat source that your health care provider recommends, such as a moist heat pack or a heating pad. ? If you have a removable assistive device, remove it as told by your health care provider. ? Place a towel between your skin and the heat source. If your health care provider tells you to keep the assistive device on while you apply heat, place a towel between the assistive device and the heat source. ? Leave the heat on for 20-30 minutes. ? Remove the heat if your skin turns bright red. This is especially important if you are unable to feel pain, heat, or cold. You may have a greater risk of getting burned.  If directed, put ice on the affected joint: ? If you have a removable assistive device, remove it as told by your health care provider. ? Put ice in a plastic bag. ? Place a towel between your skin and the bag. If your health care provider tells you to keep the assistive device on during icing, place a towel between the assistive device and the bag. ? Leave the ice on for 20 minutes, 2-3 times a day. General instructions  Take over-the-counter and prescription medicines only as told by your health care provider.  Maintain a healthy weight. Follow instructions from your health care provider for weight control. These may include dietary restrictions.  Do not use any products that contain nicotine or tobacco, such as cigarettes and e-cigarettes. These can delay bone healing. If you need help quitting, ask your health care provider.  Use assistive devices as directed by your health care provider.  Keep all follow-up visits as told by your health care provider. This is important. Where to find more information  [employer] of Arthritis and Musculoskeletal and Skin Diseases:  www.niams.SouthExposed.es  [employer] on Aging: http://kim-miller.com/  American College of Rheumatology: www.rheumatology.org Contact a health care provider if:  Your skin turns red.  You develop a rash.  You have pain that gets worse.  You have a fever along with joint or muscle aches. Get help right away if:  You lose a lot of weight.  You suddenly lose your appetite.  You have night sweats. Summary  Osteoarthritis is a type of arthritis that affects tissue covering the ends of bones in joints (cartilage).  This condition is caused by age-related wearing down of cartilage that covers the ends of bones.  The main symptom of this condition is pain, swelling, and stiffness in the joint.  There is no cure for this condition, but treatment can help to control pain and improve joint function. This information is not intended to replace advice given to you by your health care provider. Make sure you discuss any questions you have with your health care provider. Document Revised: 06/10/2017 Document Reviewed: 03/01/2016 Elsevier Patient Education  2020 [place] have received an injection of steroids into the joint. 15% of patients will have increased pain within the 24 hours  postinjection.   This is transient and will go away.   We recommend that you use ice packs on the injection site for 20 minutes every 2 hours and extra strength Tylenol 2 tablets every 8 as needed until the pain resolves.  If you continue to have pain after taking the Tylenol and using the ice please call the office for further instructions.   Do the exercises 3 x a week

## 2019-10-19 NOTE — Progress Notes (Signed)
Chief Complaint  Patient presents with  . Knee Pain    Left knee pain, no injury, referred by Dr. Karilyn Cota.    New patient for [name]  This is a 49 year old female [name] comes with a history of painful left knee starting back in February of this year.  She saw her primary care doctor Karilyn Cota and he worked up with history and physical I have his notes she was having knee pain at that time she was put on a prednisone 40 mg daily with breakfast gave her 2 tablets for 5days.  Orthopedic follow-up was arranged.  It apparently was evident at that time and she confirms by his notes and her history that she may have injured the knee when she stepped out of her husband's truck she had increased pain in the knee did not feel any swelling weightbearing did make the pain worse and she eventually lost motion in terms of flexion with some radicular symptoms which have since resolved.ros  Review of Systems  Constitutional: Negative for chills, fever, malaise/fatigue and weight loss.  Gastrointestinal: Negative for constipation.       Denies loss bowel control   Genitourinary:       Denies urinary retention or los of bladder control    Past Medical History:  Diagnosis Date  . Vertigo    Past Surgical History:  Procedure Laterality Date  . ABDOMINAL HYSTERECTOMY    . CESAREAN SECTION    . CHOLECYSTECTOMY N/A 05/26/2018   Procedure: LAPAROSCOPIC CHOLECYSTECTOMY;  Surgeon: Lucretia Roers, MD;  Location: [facility];  Service: General;  Laterality: N/A;   No Known Allergies Social History   Tobacco Use  . Smoking status: Never Smoker  . Smokeless tobacco: Never Used  Substance Use Topics  . Alcohol use: No  . Drug use: No    BP 126/80   Pulse 69   Temp 97.6 F (36.4 C)   Ht 5\' 4"  (1.626 m)   Wt 227 lb (103 kg)   BMI 38.96 kg/m   Physical Exam Vitals and nursing note reviewed.  Constitutional:      Appearance: Normal appearance.  Musculoskeletal:     Lumbar back: No  deformity, spasms, tenderness or bony tenderness. Normal range of motion. Negative right straight leg raise test and negative left straight leg raise test.     Right knee: Crepitus present. No swelling, deformity, effusion, erythema, ecchymosis, lacerations or bony tenderness. Normal range of motion. No tenderness. No LCL laxity, MCL laxity, ACL laxity or PCL laxity. Normal alignment, normal meniscus and normal patellar mobility. Normal pulse.     Instability Tests: Negative medial McMurray test and negative lateral McMurray test.     Left knee: Effusion and bony tenderness present. No swelling, deformity, erythema, ecchymosis, lacerations or crepitus. Normal range of motion. Tenderness present over the medial joint line. No LCL laxity, MCL laxity, ACL laxity or PCL laxity.Normal alignment, normal meniscus and normal patellar mobility. Normal pulse.     Instability Tests: Negative medial McMurray test and negative lateral McMurray test.  Neurological:     Mental Status: She is alert and oriented to person, place, and time.  Psychiatric:        Mood and Affect: Mood normal.    Encounter Diagnoses  Name Primary?  . Chronic pain of left knee   . Primary localized osteoarthritis of knee Yes    Patient has osteoarthritis of the knee on x-ray please see the report the arthritis looks to be mild its  medial correlates with her medial bone tenderness  Recommend weight loss exercise anti-inflammatories injection  Procedure note left knee injection   verbal consent was obtained to inject left knee joint  Timeout was completed to confirm the site of injection  The medications used were 40 mg of Depo-Medrol and 1% lidocaine 3 cc  Anesthesia was provided by ethyl chloride and the skin was prepped with alcohol.  After cleaning the skin with alcohol a 20-gauge needle was used to inject the left knee joint. There were no complications. A sterile bandage was applied.    Meds ordered this encounter   Medications  . meloxicam (MOBIC) 7.5 MG tablet    Sig: Take 1 tablet (7.5 mg total) by mouth daily.    Dispense:  30 tablet    Refill:  5   A follow-up visit in 3 months

## 2019-10-31 ENCOUNTER — Encounter (INDEPENDENT_AMBULATORY_CARE_PROVIDER_SITE_OTHER): Payer: Self-pay | Admitting: Internal Medicine

## 2019-10-31 ENCOUNTER — Ambulatory Visit (INDEPENDENT_AMBULATORY_CARE_PROVIDER_SITE_OTHER): Payer: BC Managed Care – PPO | Admitting: Internal Medicine

## 2019-10-31 ENCOUNTER — Encounter (INDEPENDENT_AMBULATORY_CARE_PROVIDER_SITE_OTHER): Payer: Self-pay

## 2019-10-31 ENCOUNTER — Other Ambulatory Visit: Payer: Self-pay

## 2019-10-31 VITALS — BP 145/80 | HR 71 | Temp 97.3°F | Ht 64.0 in | Wt 233.6 lb

## 2019-10-31 DIAGNOSIS — R5383 Other fatigue: Secondary | ICD-10-CM | POA: Diagnosis not present

## 2019-10-31 DIAGNOSIS — E559 Vitamin D deficiency, unspecified: Secondary | ICD-10-CM

## 2019-10-31 DIAGNOSIS — R5381 Other malaise: Secondary | ICD-10-CM

## 2019-10-31 DIAGNOSIS — I10 Essential (primary) hypertension: Secondary | ICD-10-CM | POA: Diagnosis not present

## 2019-10-31 DIAGNOSIS — Z131 Encounter for screening for diabetes mellitus: Secondary | ICD-10-CM

## 2019-10-31 LAB — CBC
MCHC: 33.1 g/dL (ref 32.0–36.0)
MCV: 82.9 fL (ref 80.0–100.0)
MPV: 9.9 fL (ref 7.5–12.5)

## 2019-10-31 NOTE — Progress Notes (Signed)
Metrics: Intervention Frequency ACO  Documented Smoking Status Yearly  Screened one or more times in 24 months  Cessation Counseling or  Active cessation medication Past 24 months  Past 24 months   Guideline developer: UpToDate (See UpToDate for funding source) Date Released: 2014       Wellness Office Visit  Subjective:  Patient ID: [name], female    DOB: 03/02/1971  Age: 49 y.o. MRN: [id number]  CC: This lady was meant to come for an annual physical today but she appeared late to the visit so we will convert this into an office visit to follow-up her chronic conditions including obesity, hypertension and vitamin D deficiency. HPI  She is compliant with amlodipine and her blood pressure has been stable.  Today she was rushing to get to the appointment and was late and her blood pressure is elevated today. She has been taking vitamin D3 at a higher dose of 10,000 units daily. She went to see Dr. Romeo Dyer, orthopedics, for left knee pain and had a steroid injection in the knee which is significantly helped her pain. She denies any chest pain, dyspnea, palpitations or limb weakness. She describes intermittent fatigue. Past Medical History:  Diagnosis Date  . Hypertension   . Vertigo       Family History  Problem Relation Age of Onset  . Hypertension Mother   . Diabetes Father   . Hypertension Father     Social History   Social History Narrative   Married for 11 years.Lives with husband and son.   Social History   Tobacco Use  . Smoking status: Never Smoker  . Smokeless tobacco: Never Used  Substance Use Topics  . Alcohol use: No    Current Meds  Medication Sig  . amLODipine (NORVASC) 5 MG tablet Take 1 tablet (5 mg total) by mouth daily.  . Cholecalciferol 1.25 MG (50000 UT) TABS Take 2 tablets by mouth.      Objective:   Today's Vitals: BP (!) 145/80 (BP Location: Right Arm, Patient Position: Sitting, Cuff Size: Normal)   Pulse 71   Temp (!)  97.3 F (36.3 C) (Temporal)   Ht 5\' 4"  (1.626 m)   Wt 233 lb 9.6 oz (106 kg)   SpO2 97%   BMI 40.10 kg/m  Vitals with BMI 10/31/2019 10/19/2019 10/08/2019  Height 5\' 4"  5\' 4"  5\' 4"   Weight 233 lbs 10 oz 227 lbs 233 lbs  BMI 40.08 38.95 39.97  Systolic 145 126 10/10/2019  Diastolic 80 80 72  Pulse 71 69 88     Physical Exam  She is now morbidly obese as she has gained further weight and her blood pressure is elevated today systolic.  She is alert and orientated without any focal neurological signs.     Assessment   1. Vitamin D deficiency   2. Essential hypertension   3. Morbid obesity (HCC)   4. Malaise and fatigue   5. Screening for diabetes mellitus       Tests ordered Orders Placed This Encounter  Procedures  . CBC  . COMPLETE METABOLIC PANEL WITH GFR  . Hemoglobin A1c  . Lipid panel  . T3, free  . T4  . TSH  . VITAMIN D 25 Hydroxy (Vit-D Deficiency, Fractures)     Plan: 1. She will continue with amlodipine for hypertension.  This overall is controlling her blood pressure. 2. She will continue with vitamin D3 supplementation and we will check levels today. 3. I have given  her a diet sheet regarding nutrition and she really needs to lose weight which will help her overall health. 4. Blood work is ordered above. 5. I will see her in July when she has a week off and we will do the annual physical at that time.   No orders of the defined types were placed in this encounter.   Doree Albee, MD

## 2019-10-31 NOTE — Patient Instructions (Signed)
[name] Dietary Recommendations for Weight Loss What to Avoid . Avoid added sugars o Often added sugar can be found in processed foods such as many condiments, dry cereals, cakes, cookies, chips, crisps, crackers, candies, sweetened drinks, etc.  o Read labels and AVOID/DECREASE use of foods with the following in their ingredient list: Sugar, fructose, high fructose corn syrup, sucrose, glucose, maltose, dextrose, molasses, cane sugar, brown sugar, any type of syrup, agave nectar, etc.   . Avoid snacking in between meals . Avoid foods made with flour o If you are going to eat food made with flour, choose those made with whole-grains; and, minimize your consumption as much as is tolerable . Avoid processed foods o These foods are generally stocked in the middle of the grocery store. Focus on shopping on the perimeter of the grocery.  . Avoid Meat  o We recommend following a plant-based diet at [name]. Thus, we recommend avoiding meat as a general rule. Consider eating beans, legumes, eggs, and/or dairy products for regular protein sources o If you plan on eating meat limit to 4 ounces of meat at a time and choose lean options such as Fish, chicken, turkey. Avoid red meat intake such as pork and/or steak What to Include . Vegetables o GREEN LEAFY VEGETABLES: Kale, spinach, mustard greens, collard greens, cabbage, broccoli, etc. o OTHER: Asparagus, cauliflower, eggplant, carrots, peas, Brussel sprouts, tomatoes, bell peppers, zucchini, beets, cucumbers, etc. . Grains, seeds, and legumes o Beans: kidney beans, black eyed peas, garbanzo beans, black beans, pinto beans, etc. o Whole, unrefined grains: brown rice, barley, bulgur, oatmeal, etc. . Healthy fats  o Avoid highly processed fats such as vegetable oil o Examples of healthy fats: avocado, olives, virgin olive oil, dark chocolate (?72% Cocoa), nuts (peanuts, almonds, walnuts, cashews, pecans, etc.) . None to Low  Intake of Animal Sources of Protein o Meat sources: chicken, turkey, salmon, tuna. Limit to 4 ounces of meat at one time. o Consider limiting dairy sources, but when choosing dairy focus on: PLAIN Greek yogurt, cottage cheese, high-protein milk . Fruit o Choose berries  When to Eat . Intermittent Fasting: o Choosing not to eat for a specific time period, but DO FOCUS ON HYDRATION when fasting o Multiple Techniques: - Time Restricted Eating: eat 3 meals in a day, each meal lasting no more than 60 minutes, no snacks between meals - 16-18 hour fast: fast for 16 to 18 hours up to 7 days a week. Often suggested to start with 2-3 nonconsecutive days per week.  . Remember the time you sleep is counted as fasting.  . Examples of eating schedule: Fast from 7:00pm-11:00am. Eat between 11:00am-7:00pm.  - 24-hour fast: fast for 24 hours up to every other day. Often suggested to start with 1 day per week . Remember the time you sleep is counted as fasting . Examples of eating schedule:  o Eating day: eat 2-3 meals on your eating day. If doing 2 meals, each meal should last no more than 90 minutes. If doing 3 meals, each meal should last no more than 60 minutes. Finish last meal by 7:00pm. o Fasting day: Fast until 7:00pm.  o IF YOU FEEL UNWELL FOR ANY REASON/IN ANY WAY WHEN FASTING, STOP FASTING BY EATING A NUTRITIOUS SNACK OR LIGHT MEAL o ALWAYS FOCUS ON HYDRATION DURING FASTS - Acceptable Hydration sources: water, broths, tea/coffee (black tea/coffee is best but using a small amount of whole-fat dairy products in coffee/tea is acceptable).  -   Poor Hydration Sources: anything with sugar or artificial sweeteners added to it  These recommendations have been developed for patients that are actively receiving medical care from either Dr. Nyiesha Beever or Sarah Gray, DNP, NP-C at [facility]. These recommendations are developed for patients with specific medical conditions and are not meant to be  distributed or used by others that are not actively receiving care from either provider listed above at [facility]. It is not appropriate to participate in the above eating plans without proper medical supervision.   Reference: Fung, J. The obesity code. [place]: [place]; 2016.   

## 2019-11-01 LAB — CBC
HCT: 36.9 % (ref 35.0–45.0)
Hemoglobin: 12.2 g/dL (ref 11.7–15.5)
MCH: 27.4 pg (ref 27.0–33.0)
Platelets: 349 10*3/uL (ref 140–400)
RBC: 4.45 10*6/uL (ref 3.80–5.10)
RDW: 14.1 % (ref 11.0–15.0)
WBC: 10.9 10*3/uL — ABNORMAL HIGH (ref 3.8–10.8)

## 2019-11-01 LAB — HEMOGLOBIN A1C
Hgb A1c MFr Bld: 6.1 % of total Hgb — ABNORMAL HIGH (ref <5.7)
Mean Plasma Glucose: 128 (calc)
eAG (mmol/L): 7.1 (calc)

## 2019-11-01 LAB — COMPLETE METABOLIC PANEL WITH GFR
AG Ratio: 1.2 (calc) (ref 1.0–2.5)
ALT: 15 U/L (ref 6–29)
AST: 16 U/L (ref 10–35)
Albumin: 3.9 g/dL (ref 3.6–5.1)
Alkaline phosphatase (APISO): 91 U/L (ref 31–125)
BUN: 10 mg/dL (ref 7–25)
CO2: 24 mmol/L (ref 20–32)
Calcium: 9 mg/dL (ref 8.6–10.2)
Chloride: 103 mmol/L (ref 98–110)
Creat: 0.89 mg/dL (ref 0.50–1.10)
GFR, Est African American: 90 mL/min/{1.73_m2} (ref >=60)
GFR, Est Non African American: 78 mL/min/{1.73_m2} (ref >=60)
Globulin: 3.2 g/dL (calc) (ref 1.9–3.7)
Glucose, Bld: 86 mg/dL (ref 65–99)
Potassium: 3.7 mmol/L (ref 3.5–5.3)
Sodium: 138 mmol/L (ref 135–146)
Total Bilirubin: 0.4 mg/dL (ref 0.2–1.2)
Total Protein: 7.1 g/dL (ref 6.1–8.1)

## 2019-11-01 LAB — T4: T4, Total: 8.4 ug/dL (ref 5.1–11.9)

## 2019-11-01 LAB — LIPID PANEL
Cholesterol: 210 mg/dL — ABNORMAL HIGH (ref <200)
HDL: 45 mg/dL — ABNORMAL LOW (ref >=50)
LDL Cholesterol (Calc): 143 mg/dL (calc) — ABNORMAL HIGH
Non-HDL Cholesterol (Calc): 165 mg/dL (calc) — ABNORMAL HIGH (ref <130)
Total CHOL/HDL Ratio: 4.7 (calc) (ref <5.0)
Triglycerides: 103 mg/dL (ref <150)

## 2019-11-01 LAB — T3, FREE: T3, Free: 3 pg/mL (ref 2.3–4.2)

## 2019-11-01 LAB — VITAMIN D 25 HYDROXY (VIT D DEFICIENCY, FRACTURES): Vit D, 25-Hydroxy: 59 ng/mL (ref 30–100)

## 2019-11-01 LAB — TSH: TSH: 1.2 mIU/L

## 2020-01-28 ENCOUNTER — Ambulatory Visit (INDEPENDENT_AMBULATORY_CARE_PROVIDER_SITE_OTHER): Payer: BC Managed Care – PPO | Admitting: Nurse Practitioner

## 2020-01-28 ENCOUNTER — Other Ambulatory Visit: Payer: Self-pay

## 2020-01-28 ENCOUNTER — Encounter (INDEPENDENT_AMBULATORY_CARE_PROVIDER_SITE_OTHER): Payer: Self-pay | Admitting: Nurse Practitioner

## 2020-01-28 VITALS — BP 145/90 | HR 74 | Temp 97.5°F | Ht 64.0 in | Wt 234.8 lb

## 2020-01-28 DIAGNOSIS — Z1231 Encounter for screening mammogram for malignant neoplasm of breast: Secondary | ICD-10-CM | POA: Diagnosis not present

## 2020-01-28 DIAGNOSIS — R7303 Prediabetes: Secondary | ICD-10-CM | POA: Insufficient documentation

## 2020-01-28 DIAGNOSIS — E785 Hyperlipidemia, unspecified: Secondary | ICD-10-CM

## 2020-01-28 DIAGNOSIS — Z23 Encounter for immunization: Secondary | ICD-10-CM | POA: Diagnosis not present

## 2020-01-28 DIAGNOSIS — I10 Essential (primary) hypertension: Secondary | ICD-10-CM

## 2020-01-28 DIAGNOSIS — Z0001 Encounter for general adult medical examination with abnormal findings: Secondary | ICD-10-CM | POA: Diagnosis not present

## 2020-01-28 DIAGNOSIS — E559 Vitamin D deficiency, unspecified: Secondary | ICD-10-CM

## 2020-01-28 NOTE — Progress Notes (Signed)
Subjective:  Patient ID: [name], female    DOB: [birth year]  Age: 49 y.o. MRN: [id number]  CC:  Chief Complaint  Patient presents with  . Annual Exam      HPI  This patient comes in today for her annual physical exam.  She has had the COVID-19 vaccine.  She believes she is due for the tetanus shot because she thinks is been over 10 years she has had her last tetanus shot.  She tells me she is going right ago on vacation tomorrow, and does not want to have any soreness if possible.  She does not have any medical history that would indicate the need for pneumonia vaccine at this time.  She has had a hysterectomy and does not need Pap smears any longer.  [employer] [facility] does recommend colon cancer screening starting at age 49 at this time, however she tells me she does not believe that her insurance company will pay for this right now.  She has had sexual transmitted infection screening completed in the past.  She is a never smoker.  She has not had a mammogram and is agreeable to undergoing breast cancer screening via mammogram.  She is due for depression screening.  She has had hepatitis C screening completed in the past.  She has had blood work collected at her last office visit back in April.  At that time metabolic panel was normal, she was not anemic, thyroid panel was normal, vitamin D level was good on her current supplement, she did have hyperlipidemia, and she is prediabetic.  A1c was 6.1.  ASCVD risk score is currently 6.8%.  She continues only on her vitamin D3 supplement and amlodipine 5 mg daily.   Past Medical History:  Diagnosis Date  . Hypertension   . Vertigo       Family History  Problem Relation Age of Onset  . Hypertension Mother   . Diabetes Father   . Hypertension Father     Social History   Social History Narrative   Married for 11 years.Lives with husband and son.   Social History   Tobacco Use  . Smoking status: Never Smoker  . Smokeless  tobacco: Never Used  Substance Use Topics  . Alcohol use: No     Current Meds  Medication Sig  . amLODipine (NORVASC) 5 MG tablet Take 1 tablet (5 mg total) by mouth daily.  . Cholecalciferol 1.25 MG (50000 UT) TABS Take 2 tablets by mouth.    ROS:  Review of Systems  Constitutional: Negative for fever and malaise/fatigue.  Eyes: Negative for blurred vision.  Respiratory: Negative for cough and shortness of breath.   Cardiovascular: Negative for chest pain.  Gastrointestinal: Negative for abdominal pain and blood in stool.  Neurological: Negative for dizziness and headaches.     Objective:   Today's Vitals: BP (!) 145/90 (BP Location: Left Arm, Patient Position: Sitting, Cuff Size: Normal)   Pulse 74   Temp (!) 97.5 F (36.4 C) (Temporal)   Ht 5\' 4"  (1.626 m)   Wt 234 lb 12.8 oz (106.5 kg)   SpO2 98%   BMI 40.30 kg/m  Vitals with BMI 01/28/2020 10/31/2019 10/19/2019  Height 5\' 4"  5\' 4"  5\' 4"   Weight 234 lbs 13 oz 233 lbs 10 oz 227 lbs  BMI 40.28 40.08 38.95  Systolic 145 145 12/19/2019  Diastolic 90 80 80  Pulse 74 71 69     Physical Exam Vitals reviewed. Exam  conducted with a chaperone present.  Constitutional:      Appearance: Normal appearance.  HENT:     Head: Normocephalic and atraumatic.     Right Ear: Tympanic membrane, ear canal and external ear normal.     Left Ear: Tympanic membrane, ear canal and external ear normal.  Eyes:     General:        Right eye: No discharge.        Left eye: No discharge.     Extraocular Movements: Extraocular movements intact.     Conjunctiva/sclera: Conjunctivae normal.     Pupils: Pupils are equal, round, and reactive to light.  Neck:     Vascular: No carotid bruit.  Cardiovascular:     Rate and Rhythm: Normal rate and regular rhythm.     Pulses: Normal pulses.     Heart sounds: Normal heart sounds. No murmur heard.   Pulmonary:     Effort: Pulmonary effort is normal.     Breath sounds: Normal breath sounds.  Chest:      Breasts: Breasts are symmetrical.        Right: Normal.        Left: Normal.  Abdominal:     General: Abdomen is flat. Bowel sounds are normal. There is no distension.     Palpations: Abdomen is soft. There is no mass.     Tenderness: There is no abdominal tenderness.  Musculoskeletal:        General: No tenderness.     Cervical back: Neck supple. No muscular tenderness.     Right lower leg: No edema.     Left lower leg: No edema.  Lymphadenopathy:     Cervical: No cervical adenopathy.     Upper Body:     Right upper body: No supraclavicular adenopathy.     Left upper body: No supraclavicular adenopathy.  Skin:    General: Skin is warm and dry.  Neurological:     General: No focal deficit present.     Mental Status: She is alert and oriented to person, place, and time.     Motor: No weakness.     Gait: Gait normal.  Psychiatric:        Mood and Affect: Mood normal.        Behavior: Behavior normal.        Judgment: Judgment normal.       PHQ9 SCORE ONLY 01/28/2020 10/31/2019  PHQ-9 Total Score 0 0      Assessment and Plan   1. Encounter for general adult medical examination with abnormal findings   2. Need for tetanus booster   3. Encounter for screening mammogram for malignant neoplasm of breast   4. Hypertension, unspecified type   5. Hyperlipidemia, unspecified hyperlipidemia type   6. Prediabetes   7. Vitamin D deficiency      Plan: 1.-3.  She will hold off on tetanus shot until she comes back from her vacation.  She was encouraged to call the office when she returns that she can have a nurses visit scheduled to have this administered.  I will order screening mammogram.  She will hold off on colon cancer screening per her request.  Depression screening was negative today.  All other screenings are up-to-date or no longer applicable.  4.-7.  Blood pressure is a bit elevated today.  She tells me that she did miss a couple doses of her amlodipine this week.  I  also went over all of her  blood work and discussed her lipid panel.  We did discuss the importance of lifestyle and adhering to a healthy diet full foods and free of processed carbohydrates.  We also discussed intermittent fasting aimed at helping to control her cholesterol as well as to treat her prediabetes.  She tells me she understands we will try to make some changes in her lifestyle.  I also went over her ASCVD risk score and discussed with this score represents.  For now she will continue on her amlodipine 5 mg her vitamin D3 supplement.   Tests ordered Orders Placed This Encounter  Procedures  . MM Digital Screening      No orders of the defined types were placed in this encounter.   Patient to follow-up in 3 months with Dr. Karilyn Cota as well as 1 year for annual physical exam, or sooner as needed.  Today in addition to performing her annual exam I also performed an office visit to address her chronic conditions.  Elenore Paddy, NP

## 2020-01-30 ENCOUNTER — Encounter (INDEPENDENT_AMBULATORY_CARE_PROVIDER_SITE_OTHER): Payer: BC Managed Care – PPO | Admitting: Internal Medicine

## 2020-02-04 ENCOUNTER — Ambulatory Visit: Payer: BC Managed Care – PPO | Admitting: Orthopedic Surgery

## 2020-02-08 ENCOUNTER — Ambulatory Visit (HOSPITAL_COMMUNITY): Payer: BC Managed Care – PPO

## 2020-02-29 ENCOUNTER — Other Ambulatory Visit: Payer: Self-pay

## 2020-02-29 ENCOUNTER — Ambulatory Visit (HOSPITAL_COMMUNITY)
Admission: RE | Admit: 2020-02-29 | Discharge: 2020-02-29 | Disposition: A | Discharge status: Home / Self Care | Payer: BC Managed Care – PPO | Source: Ambulatory Visit | Attending: Nurse Practitioner | Admitting: Nurse Practitioner

## 2020-02-29 DIAGNOSIS — Z1231 Encounter for screening mammogram for malignant neoplasm of breast: Secondary | ICD-10-CM | POA: Insufficient documentation

## 2020-04-29 ENCOUNTER — Ambulatory Visit (INDEPENDENT_AMBULATORY_CARE_PROVIDER_SITE_OTHER): Payer: BC Managed Care – PPO | Admitting: Internal Medicine

## 2020-04-29 ENCOUNTER — Encounter (INDEPENDENT_AMBULATORY_CARE_PROVIDER_SITE_OTHER): Payer: Self-pay | Admitting: Internal Medicine

## 2020-04-29 ENCOUNTER — Other Ambulatory Visit: Payer: Self-pay

## 2020-04-29 VITALS — BP 130/80 | HR 69 | Temp 97.8°F | Resp 18 | Ht 68.0 in | Wt 237.8 lb

## 2020-04-29 DIAGNOSIS — R7303 Prediabetes: Secondary | ICD-10-CM

## 2020-04-29 DIAGNOSIS — E785 Hyperlipidemia, unspecified: Secondary | ICD-10-CM | POA: Diagnosis not present

## 2020-04-29 DIAGNOSIS — E559 Vitamin D deficiency, unspecified: Secondary | ICD-10-CM | POA: Diagnosis not present

## 2020-04-29 DIAGNOSIS — Z23 Encounter for immunization: Secondary | ICD-10-CM | POA: Diagnosis not present

## 2020-04-29 DIAGNOSIS — I10 Essential (primary) hypertension: Secondary | ICD-10-CM

## 2020-04-29 NOTE — Addendum Note (Signed)
Addended by: Ronita Hipps on: 04/29/2020 05:04 PM   Modules accepted: Orders

## 2020-04-29 NOTE — Progress Notes (Signed)
Metrics: Intervention Frequency ACO  Documented Smoking Status Yearly  Screened one or more times in 24 months  Cessation Counseling or  Active cessation medication Past 24 months  Past 24 months   Guideline developer: UpToDate (See UpToDate for funding source) Date Released: 2014       Wellness Office Visit  Subjective:  Patient ID: [name], female    DOB: May 12, 1971  Age: 49 y.o. MRN: [id number]  CC: This lady comes in for follow-up of prediabetes, hypertension, vitamin D deficiency and morbid obesity. HPI  Overall, she does feel better taking amlodipine and has noticed that her blood pressure is improved.  She denies any chest pain, dyspnea, palpitations or limb weakness. She has been taking vitamin D3 supplementation for vitamin D deficiency. Unfortunately, her diet is not consistent and she has gained weight instead of losing weight. Past Medical History:  Diagnosis Date  . Hypertension   . Vertigo    Past Surgical History:  Procedure Laterality Date  . ABDOMINAL HYSTERECTOMY    . CESAREAN SECTION    . CHOLECYSTECTOMY N/A 05/26/2018   Procedure: LAPAROSCOPIC CHOLECYSTECTOMY;  Surgeon: Lucretia Roers, MD;  Location: [facility];  Service: General;  Laterality: N/A;     Family History  Problem Relation Age of Onset  . Hypertension Mother   . Diabetes Father   . Hypertension Father     Social History   Social History Narrative   Married for 11 years.Lives with husband and son.   Social History   Tobacco Use  . Smoking status: Never Smoker  . Smokeless tobacco: Never Used  Substance Use Topics  . Alcohol use: No    Current Meds  Medication Sig  . amLODipine (NORVASC) 5 MG tablet Take 1 tablet (5 mg total) by mouth daily.  . Cholecalciferol 1.25 MG (50000 UT) TABS Take 2 tablets by mouth.      Depression screen [facility] 2/9 01/28/2020 10/31/2019  Decreased Interest 0 0  Down, Depressed, Hopeless 0 0  PHQ - 2 Score 0 0     Objective:   Today's  Vitals: BP 130/80 (BP Location: Right Arm, Patient Position: Sitting, Cuff Size: Normal)   Pulse 69   Temp 97.8 F (36.6 C) (Temporal)   Resp 18   Ht 5\' 8"  (1.727 m)   Wt 237 lb 12.8 oz (107.9 kg)   SpO2 98%   BMI 36.16 kg/m  Vitals with BMI 04/29/2020 01/28/2020 10/31/2019  Height 5\' 8"  5\' 4"  5\' 4"   Weight 237 lbs 13 oz 234 lbs 13 oz 233 lbs 10 oz  BMI 36.17 40.28 40.08  Systolic 130 145 11/02/2019  Diastolic 80 90 80  Pulse 69 74 71     Physical Exam   She looks systemically well but remains morbidly obese.  Blood pressure is in a much better range now.  She is alert and orientated without any focal neurological signs.    Assessment   1. Vitamin D deficiency   2. Essential hypertension   3. Morbid obesity (HCC)   4. Prediabetes   5. Hyperlipidemia, unspecified hyperlipidemia type       Tests ordered Orders Placed This Encounter  Procedures  . COMPLETE METABOLIC PANEL WITH GFR  . Hemoglobin A1c  . Lipid panel     Plan: 1. She will continue with amlodipine which is controlling her blood pressure well. 2. We will check an A1c to see the state of her prediabetic state. 3. We discussed nutrition again and I reinforced  the idea of intermittent fasting combined with more of a plant-based diet rather than animal-based diet. 4. Follow-up in about 3 months.  Influenza vaccination was given today.   No orders of the defined types were placed in this encounter.   Wilson Singer, MD

## 2020-04-30 LAB — HEMOGLOBIN A1C
Hgb A1c MFr Bld: 6.1 % of total Hgb — ABNORMAL HIGH (ref <5.7)
Mean Plasma Glucose: 128 (calc)
eAG (mmol/L): 7.1 (calc)

## 2020-04-30 LAB — COMPLETE METABOLIC PANEL WITH GFR
AG Ratio: 1.3 (calc) (ref 1.0–2.5)
ALT: 17 U/L (ref 6–29)
AST: 18 U/L (ref 10–35)
Albumin: 4.1 g/dL (ref 3.6–5.1)
Alkaline phosphatase (APISO): 100 U/L (ref 31–125)
BUN: 8 mg/dL (ref 7–25)
CO2: 27 mmol/L (ref 20–32)
Calcium: 9.4 mg/dL (ref 8.6–10.2)
Chloride: 101 mmol/L (ref 98–110)
Creat: 0.83 mg/dL (ref 0.50–1.10)
GFR, Est African American: 97 mL/min/{1.73_m2} (ref >=60)
GFR, Est Non African American: 84 mL/min/{1.73_m2} (ref >=60)
Globulin: 3.2 g/dL (calc) (ref 1.9–3.7)
Glucose, Bld: 110 mg/dL — ABNORMAL HIGH (ref 65–99)
Potassium: 3.5 mmol/L (ref 3.5–5.3)
Sodium: 137 mmol/L (ref 135–146)
Total Bilirubin: 0.5 mg/dL (ref 0.2–1.2)
Total Protein: 7.3 g/dL (ref 6.1–8.1)

## 2020-04-30 LAB — LIPID PANEL
Cholesterol: 199 mg/dL (ref <200)
HDL: 42 mg/dL — ABNORMAL LOW (ref >=50)
LDL Cholesterol (Calc): 125 mg/dL (calc) — ABNORMAL HIGH
Non-HDL Cholesterol (Calc): 157 mg/dL (calc) — ABNORMAL HIGH (ref <130)
Total CHOL/HDL Ratio: 4.7 (calc) (ref <5.0)
Triglycerides: 198 mg/dL — ABNORMAL HIGH (ref <150)

## 2020-04-30 NOTE — Progress Notes (Signed)
Please call the patient and let her know that her prediabetes is much the same, continue to focus on nutrition that we discussed.Her total cholesterol has improved but triglycerides are elevated and HDL cholesterol is still low.  Again, focus on nutrition.Follow-up as scheduled.

## 2020-06-17 IMAGING — CR DG CHEST 1V PORT
1 series · 1 of 1 positions shown · non-contrast
Comparison: Chest radiograph 01/21/2017

CLINICAL DATA: Preoperative evaluation. Cholelithiasis.

EXAM:
PORTABLE CHEST 1 VIEW

[portable]
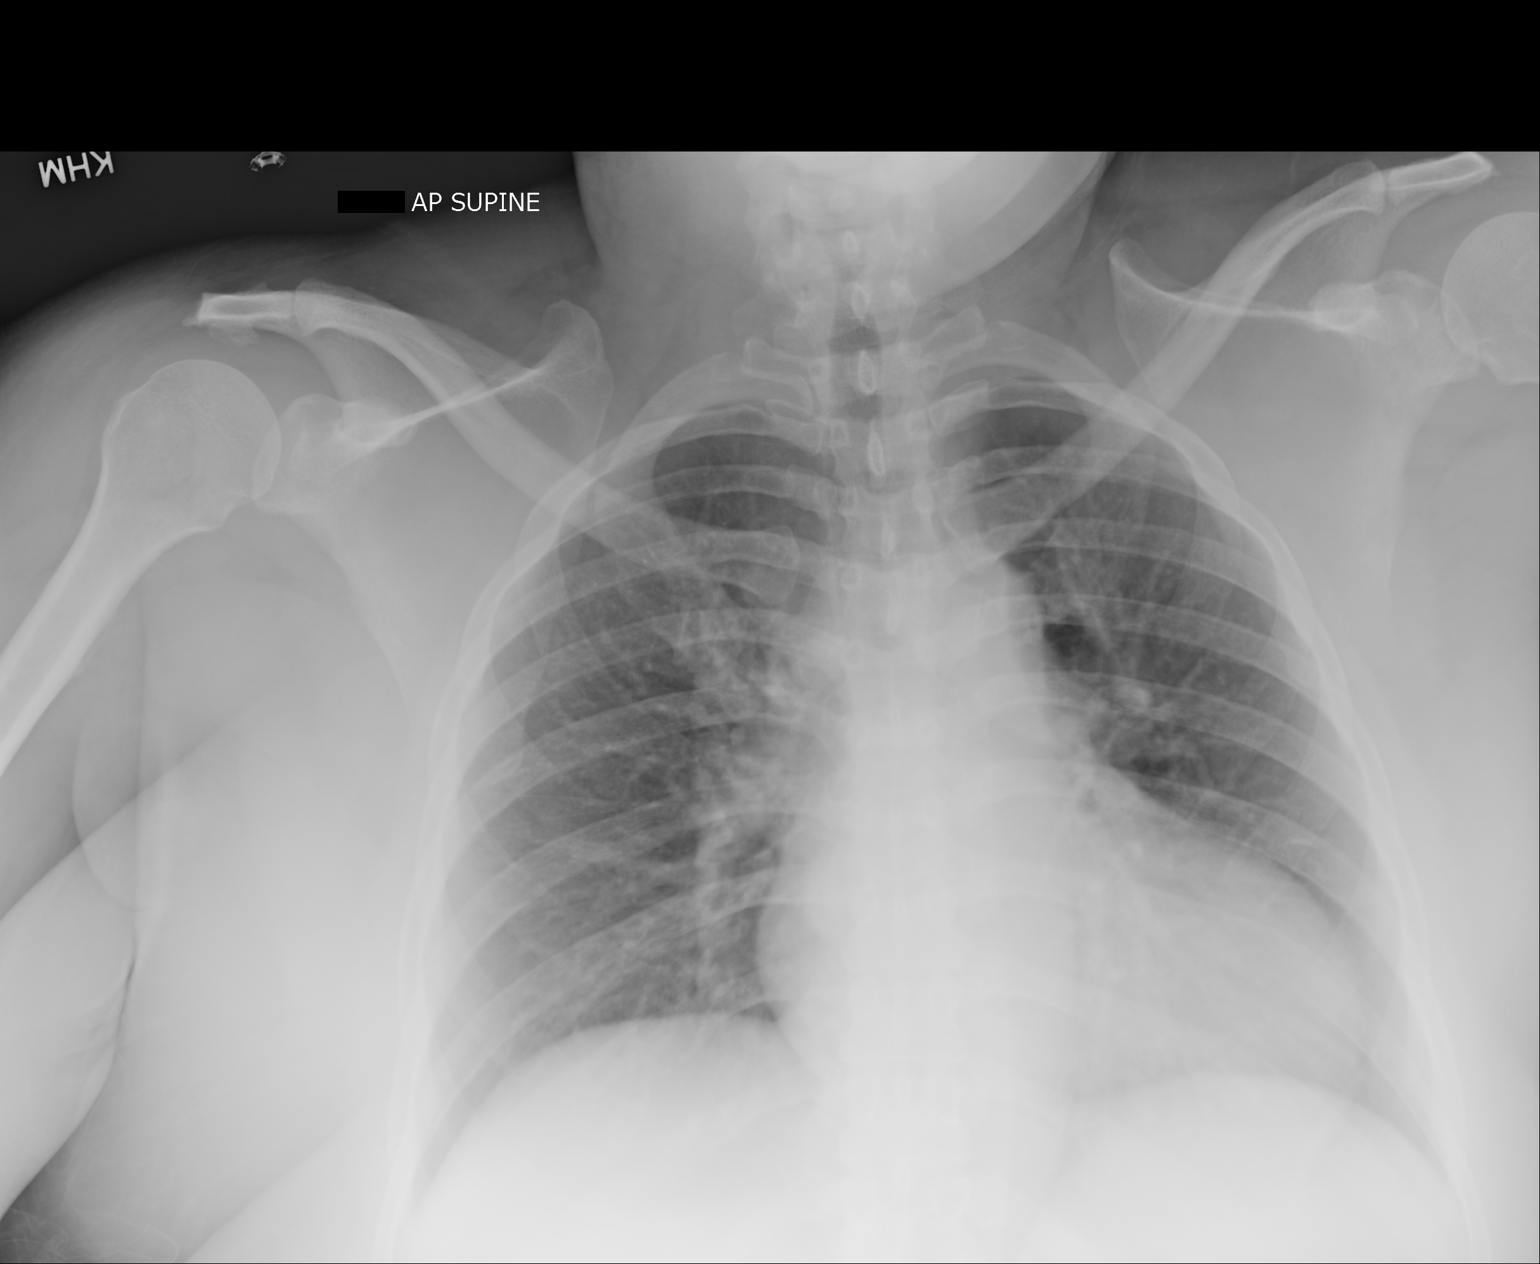

[1 of 1 positions shown; findings below may reference images not displayed]

FINDINGS: Stable enlarged cardiac and mediastinal contours. No consolidative
pulmonary opacities. No pleural effusion or pneumothorax.
IMPRESSION: No acute cardiopulmonary process.

## 2020-07-18 ENCOUNTER — Other Ambulatory Visit (INDEPENDENT_AMBULATORY_CARE_PROVIDER_SITE_OTHER): Payer: Self-pay | Admitting: Internal Medicine

## 2020-07-18 DIAGNOSIS — I10 Essential (primary) hypertension: Secondary | ICD-10-CM

## 2020-07-30 ENCOUNTER — Ambulatory Visit (INDEPENDENT_AMBULATORY_CARE_PROVIDER_SITE_OTHER): Payer: BC Managed Care – PPO | Admitting: Nurse Practitioner

## 2020-12-24 ENCOUNTER — Telehealth (INDEPENDENT_AMBULATORY_CARE_PROVIDER_SITE_OTHER): Payer: BC Managed Care – PPO | Admitting: Internal Medicine

## 2020-12-24 ENCOUNTER — Encounter (INDEPENDENT_AMBULATORY_CARE_PROVIDER_SITE_OTHER): Payer: Self-pay | Admitting: Internal Medicine

## 2020-12-24 ENCOUNTER — Telehealth (INDEPENDENT_AMBULATORY_CARE_PROVIDER_SITE_OTHER): Payer: Self-pay

## 2020-12-24 DIAGNOSIS — U071 COVID-19: Secondary | ICD-10-CM | POA: Diagnosis not present

## 2020-12-24 MED ORDER — NIRMATRELVIR/RITONAVIR (PAXLOVID)TABLET: 3.0000 | ORAL_TABLET | Freq: Two times a day (BID) | ORAL | 0 refills | Status: AC

## 2020-12-24 NOTE — Telephone Encounter (Signed)
Patient called and stated that she tested positive for Covid and her symptoms started on Saturday. Patient currently has a sore throat, cough, and congestion and wants to know if there is anything over the counter she can take or what she can take to feel better. You and Maralyn Sago are full today and no available appointments. Please advise.

## 2020-12-24 NOTE — Telephone Encounter (Signed)
Just squeeze her into my schedule for a virtual visit by phone call and tell her that I will get to her as time allows this afternoon.  She is a candidate for antiviral therapy and it must be given within 5 days of start of symptoms and this is why we probably do need to deal with her today.

## 2020-12-24 NOTE — Progress Notes (Signed)
Metrics: Intervention Frequency ACO  Documented Smoking Status Yearly  Screened one or more times in 24 months  Cessation Counseling or  Active cessation medication Past 24 months  Past 24 months   Guideline developer: UpToDate (See UpToDate for funding source) Date Released: 2014       Wellness Office Visit  Subjective:  Patient ID: [name], female    DOB: August 02, 1970  Age: 50 y.o. MRN: [id number]  CC: This is an audio visit with the permission of the patient who is at home and I am in my office.  I used 2 identifiers to identify the patient. COVID-19 disease. HPI  The patient started having symptoms 4 days ago with headache, sore throat and generally feeling unwell.  She took 2 COVID-19 tests 2 days later and they were both positive.  She has had 2 doses of vaccines of COVID-19 vaccine. Past Medical History:  Diagnosis Date   Hypertension    Vertigo    Past Surgical History:  Procedure Laterality Date   ABDOMINAL HYSTERECTOMY     CESAREAN SECTION     CHOLECYSTECTOMY N/A 05/26/2018   Procedure: LAPAROSCOPIC CHOLECYSTECTOMY;  Surgeon: Lucretia Roers, MD;  Location: [facility];  Service: General;  Laterality: N/A;     Family History  Problem Relation Age of Onset   Hypertension Mother    Diabetes Father    Hypertension Father     Social History   Social History Narrative   Married for 11 years.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   Social History   Tobacco Use   Smoking status: Never   Smokeless tobacco: Never  Substance Use Topics   Alcohol use: No    Current Meds  Medication Sig   amLODipine (NORVASC) 5 MG tablet TAKE 1 TABLET(5 MG) BY MOUTH DAILY   Cholecalciferol 1.25 MG (50000 UT) TABS Take 2 tablets by mouth.   nirmatrelvir/ritonavir EUA (PAXLOVID) TABS Take 3 tablets by mouth 2 (two) times daily for 5 days. Patient GFR is 97. Take nirmatrelvir (150 mg) two tablets twice daily for 5 days and ritonavir (100 mg) one tablet twice  daily for 5 days.       Objective:   Today's Vitals: There were no vitals taken for this visit. Vitals with BMI 12/24/2020 04/29/2020 01/28/2020  Height (No Data) 5\' 8"  5\' 4"   Weight (No Data) 237 lbs 13 oz 234 lbs 13 oz  BMI - 36.17 40.28  Systolic (No Data) 130 145  Diastolic (No Data) 80 90  Pulse (No Data) 69 74     Physical Exam  She appears to be alert and orientated and not dyspneic while she is talking to me.     Assessment   1. COVID-19       Tests ordered No orders of the defined types were placed in this encounter.    Plan: 1.  In view of her obesity, she would be at high risk for hospitalization and I have sent a prescription for Paxlovid to the pharmacy.  I have told her that if she gets worse, especially with increasing dyspnea, then she should go to the emergency room. 2.  She will follow-up for annual physical exam in July. 3.  This phone call lasted 5 minutes.    Meds ordered this encounter  Medications   nirmatrelvir/ritonavir EUA (PAXLOVID) TABS    Sig: Take 3 tablets by mouth 2 (two) times daily for 5 days. Patient GFR is 97. Take nirmatrelvir (150 mg) two tablets  twice daily for 5 days and ritonavir (100 mg) one tablet twice daily for 5 days.    Dispense:  30 tablet    Refill:  0    Laroy Mustard Normajean Glasgow, MD

## 2021-01-27 ENCOUNTER — Encounter (INDEPENDENT_AMBULATORY_CARE_PROVIDER_SITE_OTHER): Payer: BC Managed Care – PPO | Admitting: Internal Medicine

## 2021-02-04 ENCOUNTER — Encounter (INDEPENDENT_AMBULATORY_CARE_PROVIDER_SITE_OTHER): Payer: Self-pay | Admitting: Nurse Practitioner

## 2021-02-04 ENCOUNTER — Other Ambulatory Visit: Payer: Self-pay

## 2021-02-04 ENCOUNTER — Ambulatory Visit (INDEPENDENT_AMBULATORY_CARE_PROVIDER_SITE_OTHER): Payer: BC Managed Care – PPO | Admitting: Nurse Practitioner

## 2021-02-04 VITALS — BP 144/88 | HR 75 | Temp 97.3°F | Ht 65.0 in | Wt 236.0 lb

## 2021-02-04 DIAGNOSIS — R7303 Prediabetes: Secondary | ICD-10-CM | POA: Diagnosis not present

## 2021-02-04 DIAGNOSIS — Z23 Encounter for immunization: Secondary | ICD-10-CM | POA: Diagnosis not present

## 2021-02-04 DIAGNOSIS — E559 Vitamin D deficiency, unspecified: Secondary | ICD-10-CM

## 2021-02-04 DIAGNOSIS — I10 Essential (primary) hypertension: Secondary | ICD-10-CM | POA: Diagnosis not present

## 2021-02-04 DIAGNOSIS — E785 Hyperlipidemia, unspecified: Secondary | ICD-10-CM

## 2021-02-04 DIAGNOSIS — R011 Cardiac murmur, unspecified: Secondary | ICD-10-CM | POA: Insufficient documentation

## 2021-02-04 DIAGNOSIS — Z0001 Encounter for general adult medical examination with abnormal findings: Secondary | ICD-10-CM

## 2021-02-04 NOTE — Patient Instructions (Signed)
Tdap (Tetanus, Diphtheria, Pertussis) Vaccine: What You Need to Know 1. Why get vaccinated? Tdap vaccine can prevent tetanus, diphtheria, and pertussis. Diphtheria and pertussis spread from person to person. Tetanus enters the body through cuts or wounds. TETANUS (T) causes painful stiffening of the muscles. Tetanus can lead to serious health problems, including being unable to open the mouth, having trouble swallowing and breathing, or death. DIPHTHERIA (D) can lead to difficulty breathing, heart failure, paralysis, or death. PERTUSSIS (aP), also known as "whooping cough," can cause uncontrollable, violent coughing that makes it hard to breathe, eat, or drink. Pertussis can be extremely serious especially in babies and young children, causing pneumonia, convulsions, brain damage, or death. In teens and adults, it can cause weight loss, loss of bladder control, passing out, and rib fractures from severe coughing. 2. Tdap vaccine Tdap is only for children 7 years and older, adolescents, and adults.  Adolescents should receive a single dose of Tdap, preferably at age 11 or 12 years. Pregnant people should get a dose of Tdap during every pregnancy, preferably during the early part of the third trimester, to help protect the newborn from pertussis. Infants are most at risk for severe, life-threatening complications frompertussis. Adults who have never received Tdap should get a dose of Tdap. Also, adults should receive a booster dose of either Tdap or Td (a different vaccine that protects against tetanus and diphtheria but not pertussis) every 10 years, or after 5 years in the case of a severe or dirty wound or burn. Tdap may be given at the same time as other vaccines. 3. Talk with your health care provider Tell your vaccine provider if the person getting the vaccine: Has had an allergic reaction after a previous dose of any vaccine that protects against tetanus, diphtheria, or pertussis, or has any  severe, life-threatening allergies Has had a coma, decreased level of consciousness, or prolonged seizures within 7 days after a previous dose of any pertussis vaccine (DTP, DTaP, or Tdap) Has seizures or another nervous system problem Has ever had Guillain-Barr Syndrome (also called "GBS") Has had severe pain or swelling after a previous dose of any vaccine that protects against tetanus or diphtheria In some cases, your health care provider may decide to postpone Tdapvaccination until a future visit. People with minor illnesses, such as a cold, may be vaccinated. People who are moderately or severely ill should usually wait until they recover beforegetting Tdap vaccine.  Your health care provider can give you more information. 4. Risks of a vaccine reaction Pain, redness, or swelling where the shot was given, mild fever, headache, feeling tired, and nausea, vomiting, diarrhea, or stomachache sometimes happen after Tdap vaccination. People sometimes faint after medical procedures, including vaccination. Tellyour provider if you feel dizzy or have vision changes or ringing in the ears.  As with any medicine, there is a very remote chance of a vaccine causing asevere allergic reaction, other serious injury, or death. 5. What if there is a serious problem? An allergic reaction could occur after the vaccinated person leaves the clinic. If you see signs of a severe allergic reaction (hives, swelling of the face and throat, difficulty breathing, a fast heartbeat, dizziness, or weakness), call 9-1-1and get the person to the nearest hospital. For other signs that concern you, call your health care provider.  Adverse reactions should be reported to the Vaccine Adverse Event Reporting System (VAERS). Your health care provider will usually file this report, or you can do it yourself. Visit the   VAERS website at www.vaers.hhs.gov or call 1-800-822-7967. VAERS is only for reporting reactions, and VAERS staff  members do not give medical advice. 6. The National Vaccine Injury Compensation Program The National Vaccine Injury Compensation Program (VICP) is a federal program that was created to compensate people who may have been injured by certain vaccines. Claims regarding alleged injury or death due to vaccination have a time limit for filing, which may be as short as two years. Visit the VICP website at www.hrsa.gov/vaccinecompensation or call 1-800-338-2382to learn about the program and about filing a claim. 7. How can I learn more? Ask your health care provider. Call your local or state health department. Visit the website of the Food and Drug Administration (FDA) for vaccine package inserts and additional information at www.fda.gov/vaccines-blood-biologics/vaccines. Contact the Centers for Disease Control and Prevention (CDC): Call 1-800-232-4636 (1-800-CDC-INFO) or Visit CDC's website at www.cdc.gov/vaccines. Vaccine Information Statement Tdap (Tetanus, Diphtheria, Pertussis) Vaccine(02/15/2020) This information is not intended to replace advice given to you by your health care provider. Make sure you discuss any questions you have with your healthcare provider. Document Revised: 03/12/2020 Document Reviewed: 03/12/2020 Elsevier Patient Education  2022 [employer]  

## 2021-02-04 NOTE — Progress Notes (Signed)
Subjective:  Patient ID: [name], female    DOB: 1970-08-07  Age: 50 y.o. MRN: [id number]  CC:  Chief Complaint  Patient presents with   Annual Exam      HPI  This patient arrives today for the above.  She has no complaints today.  As far as her health maintenance is concerned she is due for colonoscopy but tells me her insurance will not pay for until she is 52.  She has had hysterectomy was told she does not need Pap smears anymore.  She had a appointment reminder sent to her about her mammogram and plans on getting this scheduled next month.  She feels comfortable scheduling it herself.  She is due for tetanus shot today.  Past Medical History:  Diagnosis Date   Hypertension    Vertigo       Family History  Problem Relation Age of Onset   Hypertension Mother    Diabetes Father    Hypertension Father     Social History   Social History Narrative   Married for 11 years.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   Social History   Tobacco Use   Smoking status: Never   Smokeless tobacco: Never  Substance Use Topics   Alcohol use: No     Current Meds  Medication Sig   Cholecalciferol 125 MCG (5000 UT) TABS Take 2 tablets by mouth daily.    ROS:  Review of Systems  Eyes:  Negative for blurred vision.  Respiratory:  Negative for shortness of breath.   Cardiovascular:  Negative for chest pain.  Gastrointestinal:  Negative for abdominal pain and blood in stool.  Neurological:  Negative for dizziness and headaches.    Objective:   Today's Vitals: BP (!) 144/88   Pulse 75   Temp (!) 97.3 F (36.3 C)   Ht 5\' 5"  (1.651 m)   Wt 236 lb (107 kg)   SpO2 95%   BMI 39.27 kg/m  Vitals with BMI 02/04/2021 12/24/2020 04/29/2020  Height 5\' 5"  (No Data) 5\' 8"   Weight 236 lbs (No Data) 237 lbs 13 oz  BMI 39.27 - 36.17  Systolic 144 (No Data) 130  Diastolic 88 (No Data) 80  Pulse 75 (No Data) 69     Physical Exam Vitals reviewed.   Constitutional:      Appearance: Normal appearance.  HENT:     Head: Normocephalic and atraumatic.     Right Ear: Tympanic membrane, ear canal and external ear normal.     Left Ear: Tympanic membrane, ear canal and external ear normal.  Eyes:     General:        Right eye: No discharge.        Left eye: No discharge.     Extraocular Movements: Extraocular movements intact.     Conjunctiva/sclera: Conjunctivae normal.     Pupils: Pupils are equal, round, and reactive to light.  Neck:     Vascular: No carotid bruit.  Cardiovascular:     Rate and Rhythm: Normal rate and regular rhythm.     Pulses: Normal pulses.     Heart sounds: Murmur heard.  Pulmonary:     Effort: Pulmonary effort is normal.     Breath sounds: Normal breath sounds.  Chest:  Breasts:    Right: No supraclavicular adenopathy.     Left: No supraclavicular adenopathy.     Comments: Breast exam deferred per patient preference Abdominal:     General:  Abdomen is flat. Bowel sounds are normal. There is no distension.     Palpations: Abdomen is soft. There is no mass.     Tenderness: There is no abdominal tenderness.  Musculoskeletal:        General: No tenderness.     Cervical back: Neck supple. No muscular tenderness.     Right lower leg: No edema.     Left lower leg: No edema.  Lymphadenopathy:     Cervical: No cervical adenopathy.     Upper Body:     Right upper body: No supraclavicular adenopathy.     Left upper body: No supraclavicular adenopathy.  Skin:    General: Skin is warm and dry.  Neurological:     General: No focal deficit present.     Mental Status: She is alert and oriented to person, place, and time.     Motor: No weakness.     Gait: Gait normal.  Psychiatric:        Mood and Affect: Mood normal.        Behavior: Behavior normal.        Judgment: Judgment normal.         Assessment and Plan   1. Encounter for general adult medical examination with abnormal findings   2. Need for  diphtheria-tetanus-pertussis (Tdap) vaccine   3. Murmur   4. Hypertension, unspecified type   5. Vitamin D deficiency   6. Hyperlipidemia, unspecified hyperlipidemia type   7. Prediabetes      Plan: 1.,  7.  We will collect blood work for further evaluation today.  We will also administer Tdap today.  She is asymptomatic regarding her cardiac heart murmur.  We did discuss red flag symptoms such as increased fatigue, leg swelling, orthopnea, shortness of breath and if these were to occur to let me know which point we can do an echocardiogram.  Blood pressure slightly elevated today, but she tells me she monitors her blood pressure at home and usually systolic blood pressures around 130 and diastolic blood pressure is 80 or lower.  For now she continue on her amlodipine but she was told if her blood pressure increases to >140/>90 that she should notify us before her next appointment.  Tells me she understands.   Tests ordered Orders Placed This Encounter  Procedures   Tdap vaccine greater than or equal to 7yo IM   CBC   COMPLETE METABOLIC PANEL WITH GFR   Lipid panel   Hemoglobin A1c   TSH   VITAMIN D 25 Hydroxy (Vit-D Deficiency, Fractures)      No orders of the defined types were placed in this encounter.   Patient to follow-up in 6 months or sooner as needed.  Elenore Paddy, NP

## 2021-02-05 ENCOUNTER — Encounter (INDEPENDENT_AMBULATORY_CARE_PROVIDER_SITE_OTHER): Payer: Self-pay

## 2021-02-05 LAB — COMPLETE METABOLIC PANEL WITH GFR
AG Ratio: 1.2 (calc) (ref 1.0–2.5)
ALT: 16 U/L (ref 6–29)
AST: 19 U/L (ref 10–35)
Albumin: 3.8 g/dL (ref 3.6–5.1)
Alkaline phosphatase (APISO): 89 U/L (ref 31–125)
BUN: 8 mg/dL (ref 7–25)
CO2: 26 mmol/L (ref 20–32)
Calcium: 9.2 mg/dL (ref 8.6–10.2)
Chloride: 102 mmol/L (ref 98–110)
Creat: 0.89 mg/dL (ref 0.50–0.99)
Globulin: 3.3 g/dL (calc) (ref 1.9–3.7)
Glucose, Bld: 95 mg/dL (ref 65–139)
Potassium: 3.5 mmol/L (ref 3.5–5.3)
Sodium: 138 mmol/L (ref 135–146)
Total Bilirubin: 0.5 mg/dL (ref 0.2–1.2)
Total Protein: 7.1 g/dL (ref 6.1–8.1)
eGFR: 80 mL/min/{1.73_m2} (ref >=60)

## 2021-02-05 LAB — LIPID PANEL
Cholesterol: 233 mg/dL — ABNORMAL HIGH (ref <200)
HDL: 43 mg/dL — ABNORMAL LOW (ref >=50)
LDL Cholesterol (Calc): 166 mg/dL (calc) — ABNORMAL HIGH
Non-HDL Cholesterol (Calc): 190 mg/dL (calc) — ABNORMAL HIGH (ref <130)
Total CHOL/HDL Ratio: 5.4 (calc) — ABNORMAL HIGH (ref <5.0)
Triglycerides: 112 mg/dL (ref <150)

## 2021-02-05 LAB — CBC
HCT: 36.5 % (ref 35.0–45.0)
Hemoglobin: 12.1 g/dL (ref 11.7–15.5)
MCH: 27.4 pg (ref 27.0–33.0)
MCHC: 33.2 g/dL (ref 32.0–36.0)
MCV: 82.6 fL (ref 80.0–100.0)
MPV: 9.6 fL (ref 7.5–12.5)
Platelets: 368 10*3/uL (ref 140–400)
RBC: 4.42 10*6/uL (ref 3.80–5.10)
RDW: 14.5 % (ref 11.0–15.0)
WBC: 10 10*3/uL (ref 3.8–10.8)

## 2021-02-05 LAB — HEMOGLOBIN A1C
Hgb A1c MFr Bld: 6.1 % of total Hgb — ABNORMAL HIGH (ref <5.7)
Mean Plasma Glucose: 128 mg/dL
eAG (mmol/L): 7.1 mmol/L

## 2021-02-05 LAB — VITAMIN D 25 HYDROXY (VIT D DEFICIENCY, FRACTURES): Vit D, 25-Hydroxy: 68 ng/mL (ref 30–100)

## 2021-02-05 LAB — TSH: TSH: 1.18 mIU/L

## 2021-02-05 NOTE — Progress Notes (Signed)
Patient called.  Patient aware. Given results and instructions. Pt will start to cut sweets and soda out of diet & reduce red meats.

## 2021-02-05 NOTE — Progress Notes (Signed)
Patient called.  Left message for patient to call back. Also printed copy of lab results to put in mail with instructions per providers recommendations.

## 2021-02-08 ENCOUNTER — Other Ambulatory Visit (INDEPENDENT_AMBULATORY_CARE_PROVIDER_SITE_OTHER): Payer: Self-pay | Admitting: Internal Medicine

## 2021-02-08 DIAGNOSIS — I10 Essential (primary) hypertension: Secondary | ICD-10-CM

## 2021-02-12 ENCOUNTER — Encounter (INDEPENDENT_AMBULATORY_CARE_PROVIDER_SITE_OTHER): Payer: Self-pay

## 2021-05-15 ENCOUNTER — Other Ambulatory Visit (INDEPENDENT_AMBULATORY_CARE_PROVIDER_SITE_OTHER): Payer: Self-pay | Admitting: Nurse Practitioner

## 2021-05-15 DIAGNOSIS — I10 Essential (primary) hypertension: Secondary | ICD-10-CM

## 2021-06-30 DIAGNOSIS — I10 Essential (primary) hypertension: Secondary | ICD-10-CM | POA: Diagnosis not present

## 2021-06-30 DIAGNOSIS — E559 Vitamin D deficiency, unspecified: Secondary | ICD-10-CM | POA: Diagnosis not present

## 2021-06-30 DIAGNOSIS — Z0189 Encounter for other specified special examinations: Secondary | ICD-10-CM | POA: Diagnosis not present

## 2021-07-20 DIAGNOSIS — I10 Essential (primary) hypertension: Secondary | ICD-10-CM | POA: Diagnosis not present

## 2021-07-25 DIAGNOSIS — R7303 Prediabetes: Secondary | ICD-10-CM | POA: Diagnosis not present

## 2021-07-25 DIAGNOSIS — I10 Essential (primary) hypertension: Secondary | ICD-10-CM | POA: Diagnosis not present

## 2021-07-25 DIAGNOSIS — E782 Mixed hyperlipidemia: Secondary | ICD-10-CM | POA: Diagnosis not present

## 2021-07-25 DIAGNOSIS — E559 Vitamin D deficiency, unspecified: Secondary | ICD-10-CM | POA: Diagnosis not present

## 2021-07-28 ENCOUNTER — Ambulatory Visit (INDEPENDENT_AMBULATORY_CARE_PROVIDER_SITE_OTHER): Payer: BC Managed Care – PPO | Admitting: Internal Medicine

## 2021-07-31 DIAGNOSIS — Z1231 Encounter for screening mammogram for malignant neoplasm of breast: Secondary | ICD-10-CM | POA: Diagnosis not present

## 2021-09-11 DIAGNOSIS — R053 Chronic cough: Secondary | ICD-10-CM | POA: Diagnosis not present

## 2021-09-11 DIAGNOSIS — J302 Other seasonal allergic rhinitis: Secondary | ICD-10-CM | POA: Diagnosis not present

## 2021-11-09 DIAGNOSIS — E559 Vitamin D deficiency, unspecified: Secondary | ICD-10-CM | POA: Diagnosis not present

## 2021-11-09 DIAGNOSIS — I10 Essential (primary) hypertension: Secondary | ICD-10-CM | POA: Diagnosis not present

## 2021-11-10 ENCOUNTER — Emergency Department (HOSPITAL_COMMUNITY): Payer: BC Managed Care – PPO

## 2021-11-10 ENCOUNTER — Other Ambulatory Visit: Payer: Self-pay

## 2021-11-10 ENCOUNTER — Observation Stay (HOSPITAL_COMMUNITY)
Admission: EM | Admit: 2021-11-10 | Discharge: 2021-11-11 | Disposition: A | Discharge status: Home / Self Care | Payer: BC Managed Care – PPO | Attending: Family Medicine | Admitting: Family Medicine

## 2021-11-10 ENCOUNTER — Encounter (HOSPITAL_COMMUNITY): Payer: Self-pay | Admitting: Emergency Medicine

## 2021-11-10 DIAGNOSIS — Z79899 Other long term (current) drug therapy: Secondary | ICD-10-CM | POA: Insufficient documentation

## 2021-11-10 DIAGNOSIS — Z6841 Body Mass Index (BMI) 40.0 and over, adult: Secondary | ICD-10-CM | POA: Insufficient documentation

## 2021-11-10 DIAGNOSIS — E876 Hypokalemia: Secondary | ICD-10-CM

## 2021-11-10 DIAGNOSIS — I10 Essential (primary) hypertension: Secondary | ICD-10-CM | POA: Diagnosis not present

## 2021-11-10 DIAGNOSIS — R42 Dizziness and giddiness: Secondary | ICD-10-CM | POA: Diagnosis not present

## 2021-11-10 DIAGNOSIS — R0789 Other chest pain: Principal | ICD-10-CM | POA: Insufficient documentation

## 2021-11-10 DIAGNOSIS — R079 Chest pain, unspecified: Secondary | ICD-10-CM | POA: Diagnosis not present

## 2021-11-10 DIAGNOSIS — R011 Cardiac murmur, unspecified: Secondary | ICD-10-CM | POA: Diagnosis not present

## 2021-11-10 DIAGNOSIS — H8110 Benign paroxysmal vertigo, unspecified ear: Secondary | ICD-10-CM

## 2021-11-10 DIAGNOSIS — R7303 Prediabetes: Secondary | ICD-10-CM

## 2021-11-10 DIAGNOSIS — E66813 Obesity, class 3: Secondary | ICD-10-CM

## 2021-11-10 DIAGNOSIS — R519 Headache, unspecified: Secondary | ICD-10-CM | POA: Diagnosis not present

## 2021-11-10 DIAGNOSIS — Z7901 Long term (current) use of anticoagulants: Secondary | ICD-10-CM | POA: Diagnosis not present

## 2021-11-10 LAB — CBC
HCT: 40.5 % (ref 36.0–46.0)
Hemoglobin: 13.1 g/dL (ref 12.0–15.0)
MCH: 27.3 pg (ref 26.0–34.0)
MCHC: 32.3 g/dL (ref 30.0–36.0)
MCV: 84.4 fL (ref 80.0–100.0)
Platelets: 325 10*3/uL (ref 150–400)
RBC: 4.8 MIL/uL (ref 3.87–5.11)
RDW: 14.6 % (ref 11.5–15.5)
WBC: 8.1 10*3/uL (ref 4.0–10.5)
nRBC: 0 % (ref 0.0–0.2)

## 2021-11-10 LAB — BASIC METABOLIC PANEL
Anion gap: 7 (ref 5–15)
BUN: 12 mg/dL (ref 6–20)
CO2: 27 mmol/L (ref 22–32)
Calcium: 9 mg/dL (ref 8.9–10.3)
Chloride: 104 mmol/L (ref 98–111)
Creatinine, Ser: 0.94 mg/dL (ref 0.44–1.00)
GFR, Estimated: >60 mL/min (ref >60)
Glucose, Bld: 133 mg/dL — ABNORMAL HIGH (ref 70–99)
Potassium: 3.3 mmol/L — ABNORMAL LOW (ref 3.5–5.1)
Sodium: 138 mmol/L (ref 135–145)

## 2021-11-10 LAB — TROPONIN I (HIGH SENSITIVITY)
Troponin I (High Sensitivity): 4 ng/L (ref <18)
Troponin I (High Sensitivity): 9 ng/L (ref <18)
Troponin I (High Sensitivity): 9 ng/L (ref <18)

## 2021-11-10 LAB — POC URINE PREG, ED: Preg Test, Ur: NEGATIVE

## 2021-11-10 MED ORDER — DIPHENHYDRAMINE HCL 50 MG/ML IJ SOLN
12.5000 mg | Freq: Once | INTRAMUSCULAR | Status: AC
  Administered 2021-11-10: 12.5 mg via INTRAVENOUS
  Filled 2021-11-10: qty 1

## 2021-11-10 MED ORDER — MECLIZINE HCL 12.5 MG PO TABS
25.0000 mg | ORAL_TABLET | Freq: Once | ORAL | Status: AC
  Administered 2021-11-10: 25 mg via ORAL
  Filled 2021-11-10: qty 2

## 2021-11-10 MED ORDER — HEPARIN SODIUM (PORCINE) 5000 UNIT/ML IJ SOLN
5000.0000 [IU] | Freq: Three times a day (TID) | INTRAMUSCULAR | Status: DC
  Administered 2021-11-10 – 2021-11-11 (×2): 5000 [IU] via SUBCUTANEOUS
  Filled 2021-11-10 (×2): qty 1

## 2021-11-10 MED ORDER — ACETAMINOPHEN 325 MG PO TABS
650.0000 mg | ORAL_TABLET | ORAL | Status: DC | PRN
  Administered 2021-11-11: 650 mg via ORAL
  Filled 2021-11-10: qty 2

## 2021-11-10 MED ORDER — ONDANSETRON HCL 4 MG/2ML IJ SOLN: 4.0000 mg | Freq: Four times a day (QID) | INTRAMUSCULAR | Status: DC | PRN

## 2021-11-10 MED ORDER — ATORVASTATIN CALCIUM 10 MG PO TABS
10.0000 mg | ORAL_TABLET | Freq: Every day | ORAL | Status: DC
  Administered 2021-11-10 – 2021-11-11 (×2): 10 mg via ORAL
  Filled 2021-11-10 (×2): qty 1

## 2021-11-10 MED ORDER — SODIUM CHLORIDE 0.9 % IV BOLUS
1000.0000 mL | Freq: Once | INTRAVENOUS | Status: AC
  Administered 2021-11-10: 1000 mL via INTRAVENOUS

## 2021-11-10 MED ORDER — POTASSIUM CHLORIDE 20 MEQ PO PACK
40.0000 meq | PACK | Freq: Once | ORAL | Status: AC
  Administered 2021-11-10: 40 meq via ORAL
  Filled 2021-11-10: qty 2

## 2021-11-10 MED ORDER — AMLODIPINE BESYLATE 5 MG PO TABS
5.0000 mg | ORAL_TABLET | Freq: Every day | ORAL | Status: DC
  Administered 2021-11-10 – 2021-11-11 (×2): 5 mg via ORAL
  Filled 2021-11-10 (×2): qty 1

## 2021-11-10 MED ORDER — DIAZEPAM 2 MG PO TABS
2.0000 mg | ORAL_TABLET | Freq: Once | ORAL | Status: AC
  Administered 2021-11-10: 2 mg via ORAL
  Filled 2021-11-10: qty 1

## 2021-11-10 MED ORDER — METOCLOPRAMIDE HCL 5 MG/ML IJ SOLN
10.0000 mg | Freq: Once | INTRAMUSCULAR | Status: AC
  Administered 2021-11-10: 10 mg via INTRAVENOUS
  Filled 2021-11-10: qty 2

## 2021-11-10 MED ORDER — KETOROLAC TROMETHAMINE 30 MG/ML IJ SOLN
30.0000 mg | Freq: Once | INTRAMUSCULAR | Status: AC
  Administered 2021-11-10: 30 mg via INTRAVENOUS
  Filled 2021-11-10: qty 1

## 2021-11-10 NOTE — Assessment & Plan Note (Signed)
-   BMI 40.34 ?-Discussed importance of weight loss ?-Continue to monitor ?

## 2021-11-10 NOTE — H&P (Signed)
?History and Physical  ? ? ?Patient: [name] SFK:[id number] DOB: 01-04-1971 ?DOA: 11/10/2021 ?DOS: the patient was seen and examined on 11/10/2021 ?PCP: Benita Stabile, MD  ?Patient coming from: Home ? ?Chief Complaint:  ?Chief Complaint  ?Patient presents with  ? Chest Pain  ? ?HPI: [name] is a 51 y.o. female with medical history significant of, hypertension, presents the ED with a chief complaint of chest pain.  Patient just got up a flight of stairs at work when she had sudden onset of chest pain that radiated through to her back and down her right arm.  The chest pain itself was located on the right side of her chest and felt sharp and then tight.  She had shortness of breath throughout the whole day although the chest pain did not start until 1 PM.  Patient reports that during her episode of chest pain she felt lightheaded, off balance, and had palpitations.  Patient does have a history of vertigo but she reports this feels different.  Her chest was still hurting on arrival but is improved now.  She is never had pain like this before.  Patient reports that the dyspnea that she has had all day was worse with exertion and better with rest.  She had a normal appetite.  She had no cough no fever.  Patient has no other complaints at this time. ? ?Patient does not smoke, does not drink alcohol, does not use illicit drugs.  She is vaccinated for COVID.  Patient is full code. ?Review of Systems: As mentioned in the history of present illness. All other systems reviewed and are negative. ?Past Medical History:  ?Diagnosis Date  ? Hypertension   ? Vertigo   ? ?Past Surgical History:  ?Procedure Laterality Date  ? ABDOMINAL HYSTERECTOMY    ? CESAREAN SECTION    ? CHOLECYSTECTOMY N/A 05/26/2018  ? Procedure: LAPAROSCOPIC CHOLECYSTECTOMY;  Surgeon: Lucretia Roers, MD;  Location: [facility];  Service: General;  Laterality: N/A;  ? ?Social History:  reports that she has never smoked. She has never used  smokeless tobacco. She reports that she does not drink alcohol and does not use drugs. ? ?No Known Allergies ? ?Family History  ?Problem Relation Age of Onset  ? Hypertension Mother   ? Diabetes Father   ? Hypertension Father   ? ? ?Prior to Admission medications   ?Medication Sig Start Date End Date Taking? Authorizing Provider  ?amLODipine (NORVASC) 5 MG tablet TAKE 1 TABLET(5 MG) BY MOUTH DAILY ?Patient taking differently: Take 5 mg by mouth daily. 02/09/21 11/10/21 Yes Elenore Paddy, NP  ?Cholecalciferol 125 MCG (5000 UT) TABS Take 2 tablets by mouth daily.   Yes [provider]  ?OVER THE COUNTER MEDICATION Super beets   Yes [provider]  ?fluticasone (FLONASE) 50 MCG/ACT nasal spray Place 1 spray into both nostrils daily. ?Patient not taking: Reported on 11/10/2021 09/11/21   [provider]  ? ? ?Physical Exam: ?Vitals:  ? 11/10/21 1900 11/10/21 1930 11/10/21 2100 11/10/21 2200  ?BP: 123/73 (!) 140/93 (!) 164/93 115/75  ?Pulse: 69 74 88 69  ?Resp: 15 16 19 19   ?Temp:      ?TempSrc:      ?SpO2: 97% 100% 100% 98%  ?Weight:      ?Height:      ? ?1.  General: ?Patient lying supine in bed,  no acute distress ?  ?2. Psychiatric: ?Alert and oriented x 3, mood and behavior  normal for situation, pleasant and cooperative with exam ?  ?3. Neurologic: ?Speech and language are normal, face is symmetric, moves all 4 extremities voluntarily, at baseline without acute deficits on limited exam ?  ?4. HEENMT:  ?Head is atraumatic, normocephalic, pupils reactive to light, neck is supple, trachea is midline, mucous membranes are moist ?  ?5. Respiratory : ?Lungs are clear to auscultation bilaterally without wheezing, rhonchi, rales, no cyanosis, no increase in work of breathing or accessory muscle use ?  ?6. Cardiovascular : ?Heart rate normal, rhythm is regular, slight murmur, rubs or gallops, no peripheral edema, peripheral pulses palpated ?  ?7. Gastrointestinal:  ?Abdomen is soft, nondistended,  nontender to palpation bowel sounds active, no masses or organomegaly palpated ?  ?8. Skin:  ?Skin is warm, dry and intact without rashes, acute lesions, or ulcers on limited exam ?  ?9.Musculoskeletal:  ?No acute deformities or trauma, no asymmetry in tone, no peripheral edema, peripheral pulses palpated, no tenderness to palpation in the extremities ? ?Data Reviewed: ?In the ED ?Temp 97.9, heart rate 51-74, respiratory rate 14-22, blood pressure 140/93, satting at 97-100% ?No leukocytosis with a white blood cell count of 8.1, hemoglobin 13.1, platelets 325 ?Chemistry panel reveals a hypokalemia 3.3 ?Initial Trope 4, 9, 9 ?Chest x-ray shows no active cardiopulmonary disease ?CT head because patient did have a headache in the ED shows no acute intracranial abnormality ?EKG shows a heart rate 68, sinus rhythm, QTc 4 and 38 ?Admission requested because of chest pain, risk factors, and delta 5 troponin ?Assessment and Plan: ?* Chest pain ?- Risk factors include prediabetes, hypertension, hyperlipidemia, obesity ?-Chest pain had typical and atypical features ?-Troponin initially 4, plateaued at 9 ?-Stress test ordered for a.m. ?-Patient is currently chest pain-free ?-Start statin ?-Last lipid panel was a year ago and showed hyperlipidemia, fasting lipid panel in the a.m. ?-Continue to monitor ? ?Hypokalemia ?- Potassium 3.3 ?-40 mEq potassium given at admission ?-Recheck in the a.m. with mag ? ?Obesity, Class III, BMI 40-49.9 (morbid obesity) (HCC) ?- BMI 40.34 ?-Discussed importance of weight loss ?-Continue to monitor ? ?Murmur ?- Known murmur ?-Trivial mitral valve regurg on echo in 2009 ?-Continue to monitor ? ?Prediabetes ?- Glucose controlled in the ED at 133 ?-Continue to monitor CBGs ?-Add sliding scale if indicated ?-Continue to monitor ? ?Hypertension ?- Continue amlodipine ? ? ? ? ? Advance Care Planning:   Code Status: Full Code  ? ?Consults: Cardiology ? ?Family Communication: No family at  bedside ? ?Severity of Illness: ?The appropriate patient status for this patient is OBSERVATION. Observation status is judged to be reasonable and necessary in order to provide the required intensity of service to ensure the patient's safety. The patient's presenting symptoms, physical exam findings, and initial radiographic and laboratory data in the context of their medical condition is felt to place them at decreased risk for further clinical deterioration. Furthermore, it is anticipated that the patient will be medically stable for discharge from the hospital within 2 midnights of admission.  ? ?Author: ?Lilyan Gilford, DO ?11/10/2021 11:34 PM ? ?For on call review [email or web address].  ?

## 2021-11-10 NOTE — Assessment & Plan Note (Signed)
-   Risk factors include prediabetes, hypertension, hyperlipidemia, obesity ?-Chest pain had typical and atypical features ?-Troponin initially 4, plateaued at 9 ?-Stress test ordered for a.m. ?-Patient is currently chest pain-free ?-Start statin ?-Last lipid panel was a year ago and showed hyperlipidemia, fasting lipid panel in the a.m. ?-Continue to monitor ?

## 2021-11-10 NOTE — ED Triage Notes (Signed)
Pt states she was walking upstairs and start having mid-sternal chest pain radiating to back, with lightheadedness.  ?

## 2021-11-10 NOTE — Assessment & Plan Note (Signed)
-   Glucose controlled in the ED at 133 ?-Continue to monitor CBGs ?-Add sliding scale if indicated ?-Continue to monitor ?

## 2021-11-10 NOTE — Assessment & Plan Note (Signed)
-   Potassium 3.3 ?-40 mEq potassium given at admission ?-Recheck in the a.m. with mag ?

## 2021-11-10 NOTE — Assessment & Plan Note (Signed)
-   Continue amlodipine ?

## 2021-11-10 NOTE — ED Provider Notes (Signed)
?[facility] ?Provider Note ? ? ?CSN: [id number] ?Arrival date & time: 11/10/21  1343 ? ?  ? ?History ? ?Chief Complaint  ?Patient presents with  ? Chest Pain  ? ? ?[name] is a 51 y.o. female with history including hypertension, hyperlipidemia, prediabetes and history of vertigo presenting for evaluation of chest pain and dizziness which started around 1 PM today.  She was walking up a flight of steps at work when she developed sharp midsternal pain that radiated into her mid back.  This was not accompanied by shortness of breath diaphoresis, nausea or vomiting.  However she did develop dizziness at the same time describing her classic vertigo, with room spinning sensation.  She denies focal weakness.  She still has some mild discomfort in her chest but much improved since the first arrival.  Her dizziness is still present with positional changes but also improved.  She has received aspirin prior to arrival. ? ?The history is provided by the patient.  ? ?HPI: A 51 year old patient with a history of hypertension, hypercholesterolemia and obesity presents for evaluation of chest pain. Initial onset of pain was approximately 3-6 hours ago. The patient's chest pain is sharp and is worse with exertion. The patient complains of nausea. The patient's chest pain is middle- or left-sided, is not well-localized, is not described as heaviness/pressure/tightness and does radiate to the arms/jaw/neck. The patient denies diaphoresis. The patient has no history of stroke, has no history of peripheral artery disease, has not smoked in the past 90 days, denies any history of treated diabetes and has no relevant family history of coronary artery disease (first degree relative at less than age 49).  ? ?Home Medications ?Prior to Admission medications   ?Medication Sig Start Date End Date Taking? Authorizing Provider  ?amLODipine (NORVASC) 5 MG tablet TAKE 1 TABLET(5 MG) BY MOUTH DAILY 02/09/21 05/10/21   Elenore PaddyGray, Sarah E, NP  ?Cholecalciferol 125 MCG (5000 UT) TABS Take 2 tablets by mouth daily.    [provider]  ?   ? ?Allergies    ?Patient has no known allergies.   ? ?Review of Systems   ?Review of Systems  ?Constitutional:  Negative for chills and fever.  ?HENT:  Negative for congestion and sore throat.   ?Eyes: Negative.   ?Respiratory:  Negative for chest tightness and shortness of breath.   ?Cardiovascular:  Positive for chest pain.  ?Gastrointestinal:  Negative for abdominal pain, diarrhea, nausea and vomiting.  ?Genitourinary: Negative.   ?Musculoskeletal:  Negative for arthralgias, joint swelling and neck pain.  ?Skin: Negative.  Negative for rash and wound.  ?Neurological:  Positive for dizziness. Negative for weakness, light-headedness, numbness and headaches.  ?Psychiatric/Behavioral: Negative.    ? ?Physical Exam ?Updated Vital Signs ?BP (!) 140/93   Pulse 74   Temp 97.9 ?F (36.6 ?C) (Oral)   Resp 16   Ht 5\' 4"  (1.626 m)   Wt 106.6 kg   SpO2 100%   BMI 40.34 kg/m?  ?Physical Exam ?Vitals and nursing note reviewed.  ?Constitutional:   ?   Appearance: She is well-developed.  ?HENT:  ?   Head: Normocephalic and atraumatic.  ?Eyes:  ?   Conjunctiva/sclera: Conjunctivae normal.  ?Cardiovascular:  ?   Rate and Rhythm: Normal rate and regular rhythm.  ?   Heart sounds: Normal heart sounds.  ?Pulmonary:  ?   Effort: Pulmonary effort is normal.  ?   Breath sounds: Normal breath sounds. No wheezing.  ?Abdominal:  ?  General: Bowel sounds are normal.  ?   Palpations: Abdomen is soft.  ?   Tenderness: There is no abdominal tenderness.  ?Musculoskeletal:     ?   General: Normal range of motion.  ?   Cervical back: Normal range of motion.  ?   Right lower leg: No tenderness. No edema.  ?   Left lower leg: No tenderness. No edema.  ?Skin: ?   General: Skin is warm and dry.  ?Neurological:  ?   General: No focal deficit present.  ?   Mental Status: She is alert and oriented to person, place, and time.   ?   Cranial Nerves: Cranial nerves 2-12 are intact.  ?   Sensory: Sensation is intact.  ?   Motor: Motor function is intact. No pronator drift.  ?   Coordination: Coordination normal.  ?   Gait: Gait normal.  ?   Comments: Equal grip strength  ? ? ?ED Results / Procedures / Treatments   ?Labs ?(all labs ordered are listed, but only abnormal results are displayed) ?Labs Reviewed  ?BASIC METABOLIC PANEL - Abnormal; Notable for the following components:  ?    Result Value  ? Potassium 3.3 (*)   ? Glucose, Bld 133 (*)   ? All other components within normal limits  ?POC URINE PREG, ED - Normal  ?CBC  ?TROPONIN I (HIGH SENSITIVITY)  ?TROPONIN I (HIGH SENSITIVITY)  ? ? ?EKG ?EKG Interpretation ? ?Date/Time:  Tuesday Nov 10 2021 14:01:12 EDT ?Ventricular Rate:  68 ?PR Interval:  176 ?QRS Duration: 86 ?QT Interval:  412 ?QTC Calculation: 438 ?R Axis:   38 ?Text Interpretation: Normal sinus rhythm Abnormal ECG Confirmed by Gloris Manchester [phone]) on 11/10/2021 7:16:09 PM ? ?Radiology ?DG Chest 2 View ? ?Result Date: 11/10/2021 ?CLINICAL DATA:  Chest pain. EXAM: CHEST - 2 VIEW COMPARISON:  Chest radiograph 05/21/2018 FINDINGS: The cardiac silhouette remains mildly enlarged. No airspace consolidation, edema, pleural effusion, or pneumothorax is identified. Right upper quadrant abdominal surgical clips are noted. No acute osseous abnormality is seen. IMPRESSION: No active cardiopulmonary disease. Electronically Signed   By: Sebastian Ache M.D.   On: 11/10/2021 14:34  ? ?CT Head Wo Contrast ? ?Result Date: 11/10/2021 ?CLINICAL DATA:  Headache, new or worsening, neuro deficit (Age 19-49y) EXAM: CT HEAD WITHOUT CONTRAST TECHNIQUE: Contiguous axial images were obtained from the base of the skull through the vertex without intravenous contrast. RADIATION DOSE REDUCTION: This exam was performed according to the departmental dose-optimization program which includes automated exposure control, adjustment of the mA and/or kV according to patient size  and/or use of iterative reconstruction technique. COMPARISON:  2018 FINDINGS: Brain: There is no acute intracranial hemorrhage, mass effect, or edema. Gray-white differentiation is preserved. There is no extra-axial fluid collection. Ventricles and sulci are within normal limits in size and configuration. Vascular: No hyperdense vessel or unexpected calcification. Skull: Calvarium is unremarkable. Sinuses/Orbits: No acute finding. Other: None. IMPRESSION: No acute intracranial abnormality. Electronically Signed   By: Guadlupe Spanish M.D.   On: 11/10/2021 18:14   ? ?Procedures ?Procedures  ? ? ?Medications Ordered in ED ?Medications  ?meclizine (ANTIVERT) tablet 25 mg (25 mg Oral Given 11/10/21 1823)  ?sodium chloride 0.9 % bolus 1,000 mL (1,000 mLs Intravenous New Bag/Given 11/10/21 1823)  ?diazepam (VALIUM) tablet 2 mg (2 mg Oral Given 11/10/21 1823)  ? ? ?ED Course/ Medical Decision Making/ A&P ?  ?HEAR Score: 4                       ?  Medical Decision Making ?Patient with complaint of exertional chest pain presenting with risk factors given her heart score 4.  She has had delta troponins with both being low but is significant delta of 5, the first troponin being 4, the second 9.  With her elevated heart score and although a small but significant bump in her troponin she would be best served by overnight observation for serial troponins and cardiology consult in the morning.  I discussed this with the patient and she is agreeable.  She is currently symptom-free.  She was also given Valium and meclizine for her vertigo and although not resolved, it is better.  She has no other neurologic exam findings to suggest central source of her vertigo.  She has prior history of vertigo as well. ? ?Amount and/or Complexity of Data Reviewed ?Labs: ordered. ?   Details: Labs significant for a glucose of 133, patient states she is prediabetic.  She has a modest hypokalemia at 3.3.  She was given oral supplementation.  Troponins are 4  and 9 respectively for delta troponin of 5 which is borderline concerning. ?Radiology: ordered. ?   Details: Chest x-ray is clear, head CT with no acute intracranial abnormality. ?ECG/medicine tests: ordered and

## 2021-11-10 NOTE — Assessment & Plan Note (Signed)
-   Known murmur ?-Trivial mitral valve regurg on echo in 2009 ?-Continue to monitor ?

## 2021-11-11 ENCOUNTER — Other Ambulatory Visit (HOSPITAL_COMMUNITY): Payer: BC Managed Care – PPO

## 2021-11-11 ENCOUNTER — Observation Stay (HOSPITAL_BASED_OUTPATIENT_CLINIC_OR_DEPARTMENT_OTHER): Payer: BC Managed Care – PPO

## 2021-11-11 ENCOUNTER — Telehealth: Payer: Self-pay

## 2021-11-11 DIAGNOSIS — R011 Cardiac murmur, unspecified: Secondary | ICD-10-CM | POA: Diagnosis not present

## 2021-11-11 DIAGNOSIS — E876 Hypokalemia: Secondary | ICD-10-CM | POA: Diagnosis not present

## 2021-11-11 DIAGNOSIS — I10 Essential (primary) hypertension: Secondary | ICD-10-CM | POA: Diagnosis not present

## 2021-11-11 DIAGNOSIS — E785 Hyperlipidemia, unspecified: Secondary | ICD-10-CM | POA: Diagnosis not present

## 2021-11-11 DIAGNOSIS — R079 Chest pain, unspecified: Secondary | ICD-10-CM

## 2021-11-11 LAB — ECHOCARDIOGRAM COMPLETE
AR max vel: 2.41 cm2
AV Area VTI: 2.28 cm2
AV Area mean vel: 2.36 cm2
AV Mean grad: 6 mmHg
AV Peak grad: 12 mmHg
Ao pk vel: 1.73 m/s
Area-P 1/2: 3.56 cm2
Calc EF: 66.2 %
Height: 64 in
MV VTI: 2.75 cm2
S' Lateral: 2.8 cm
Single Plane A2C EF: 62.2 %
Single Plane A4C EF: 69.9 %
Weight: 3760 oz

## 2021-11-11 LAB — COMPREHENSIVE METABOLIC PANEL
ALT: 21 U/L (ref 0–44)
AST: 20 U/L (ref 15–41)
Albumin: 3.1 g/dL — ABNORMAL LOW (ref 3.5–5.0)
Alkaline Phosphatase: 78 U/L (ref 38–126)
Anion gap: 4 — ABNORMAL LOW (ref 5–15)
BUN: 9 mg/dL (ref 6–20)
CO2: 26 mmol/L (ref 22–32)
Calcium: 8.4 mg/dL — ABNORMAL LOW (ref 8.9–10.3)
Chloride: 109 mmol/L (ref 98–111)
Creatinine, Ser: 0.86 mg/dL (ref 0.44–1.00)
GFR, Estimated: >60 mL/min (ref >60)
Glucose, Bld: 106 mg/dL — ABNORMAL HIGH (ref 70–99)
Potassium: 3.7 mmol/L (ref 3.5–5.1)
Sodium: 139 mmol/L (ref 135–145)
Total Bilirubin: 0.7 mg/dL (ref 0.3–1.2)
Total Protein: 6.4 g/dL — ABNORMAL LOW (ref 6.5–8.1)

## 2021-11-11 LAB — CBC
HCT: 36.6 % (ref 36.0–46.0)
Hemoglobin: 11.8 g/dL — ABNORMAL LOW (ref 12.0–15.0)
MCH: 27.3 pg (ref 26.0–34.0)
MCHC: 32.2 g/dL (ref 30.0–36.0)
MCV: 84.7 fL (ref 80.0–100.0)
Platelets: 280 10*3/uL (ref 150–400)
RBC: 4.32 MIL/uL (ref 3.87–5.11)
RDW: 14.6 % (ref 11.5–15.5)
WBC: 6.1 10*3/uL (ref 4.0–10.5)
nRBC: 0 % (ref 0.0–0.2)

## 2021-11-11 LAB — LIPID PANEL
Cholesterol: 175 mg/dL (ref 0–200)
HDL: 33 mg/dL — ABNORMAL LOW (ref >40)
LDL Cholesterol: 127 mg/dL — ABNORMAL HIGH (ref 0–99)
Total CHOL/HDL Ratio: 5.3 RATIO
Triglycerides: 77 mg/dL (ref <150)
VLDL: 15 mg/dL (ref 0–40)

## 2021-11-11 LAB — TROPONIN I (HIGH SENSITIVITY): Troponin I (High Sensitivity): 6 ng/L (ref <18)

## 2021-11-11 LAB — HIV ANTIBODY (ROUTINE TESTING W REFLEX): HIV Screen 4th Generation wRfx: NONREACTIVE

## 2021-11-11 MED ORDER — ATORVASTATIN CALCIUM 10 MG PO TABS: 10.0000 mg | ORAL_TABLET | Freq: Every day | ORAL | 0 refills | Status: DC

## 2021-11-11 MED ORDER — METOPROLOL TARTRATE 50 MG PO TABS: ORAL_TABLET | ORAL | 0 refills | Status: DC

## 2021-11-11 NOTE — Telephone Encounter (Signed)
Coronary CT scan ordered per Dr. Bjorn Pippin.  ?Lopressor 50 mg tablet sent to pharmacy for CT Scan.  ?Instruction letter in pt's chart ?

## 2021-11-11 NOTE — Telephone Encounter (Signed)
-----   Message from Ellsworth Lennox, [place] sent at 11/11/2021 12:36 PM EDT ----- ?Regarding: Outpatient Coronary CT ?Good afternoon,  ? ?This patient needs a Coronary CT for chest pain per Dr. Bjorn Pippin. Being discharged from the hospital today. Can follow-up with him in 2-3 months unless CT abnormal.  ? ?Thanks,  ?[place]  ? ?

## 2021-11-11 NOTE — Progress Notes (Signed)
Nsg Discharge Note ? ?Admit Date:  11/10/2021 ?Discharge date: 11/11/2021 ?  ?Marlowe Shores Burgo to be D/C'd Home per MD order.  AVS completed.  Copy for chart, and copy for patient signed, and dated. ?Patient/caregiver able to verbalize understanding. ? ?Discharge Medication: ?Allergies as of 11/11/2021   ?No Known Allergies ?  ? ?  ?Medication List  ?  ? ?STOP taking these medications   ? ?fluticasone 50 MCG/ACT nasal spray ?Commonly known as: FLONASE ?  ? ?  ? ?TAKE these medications   ? ?amLODipine 5 MG tablet ?Commonly known as: NORVASC ?TAKE 1 TABLET(5 MG) BY MOUTH DAILY ?What changed: See the new instructions. ?  ?atorvastatin 10 MG tablet ?Commonly known as: LIPITOR ?Take 1 tablet (10 mg total) by mouth daily. ?Start taking on: Nov 12, 2021 ?  ?Cholecalciferol 125 MCG (5000 UT) Tabs ?Take 2 tablets by mouth daily. ?  ?metoprolol tartrate 50 MG tablet ?Commonly known as: LOPRESSOR ?Take 1 tablet by mouth 2 hours prior to CT Scan ?  ?OVER THE COUNTER MEDICATION ?Super beets ?  ? ?  ? ? ?Discharge Assessment: ?Vitals:  ? 11/11/21 0538 11/11/21 1143  ?BP: 97/79 134/74  ?Pulse: 65 (!) 58  ?Resp: 19   ?Temp: (!) 97.4 ?F (36.3 ?C)   ?SpO2: 99%   ? Skin clean, dry and intact without evidence of skin break down, no evidence of skin tears noted. ?IV catheter discontinued intact. Site without signs and symptoms of complications - no redness or edema noted at insertion site, patient denies c/o pain - only slight tenderness at site.  Dressing with slight pressure applied. ? ?D/c Instructions-Education: ?Discharge instructions given to patient/family with verbalized understanding. ?D/c education completed with patient/family including follow up instructions, medication list, d/c activities limitations if indicated, with other d/c instructions as indicated by MD - patient able to verbalize understanding, all questions fully answered. ?Patient instructed to return to ED, call 911, or call MD for any changes in condition.  ?Patient  escorted via WC, and D/C home via private auto. ? ?Brandy Hale, LPN ?09/17/1827 9:37 PM  ?

## 2021-11-11 NOTE — Progress Notes (Incomplete)
*  PRELIMINARY RESULTS* ?Echocardiogram ?2D Echocardiogram has been performed. ? ?[name] ?11/11/2021, 11:19 AM ?

## 2021-11-11 NOTE — Progress Notes (Signed)
?  Transition of Care (TOC) Screening Note ? ? ?Patient Details  ?Name: [name] [facility] ?Date of Birth: Dec 30, 1970 ? ? ?Transition of Care (TOC) CM/SW Contact:    ?Villa Herb, LCSWA ?Phone Number: ?11/11/2021, 11:10 AM ? ? ? ?Transition of Care Department [facility]) has reviewed patient and no TOC needs have been identified at this time. We will continue to monitor patient advancement through interdisciplinary progression rounds. If new patient transition needs arise, please place a TOC consult. ?  ?

## 2021-11-11 NOTE — Consult Note (Addendum)
?Cardiology Consultation:  ? ?Patient ID: [name] ?MRN: [id number]; DOB: 1971-03-27 ? ?Admit date: 11/10/2021 ?Date of Consult: 11/11/2021 ? ?PCP:  Jasmine Squibb, MD ?  ?[facility] Providers ?Cardiologist: New to [name] ? ? ?Patient Profile:  ? ?[name] is a 51 y.o. female with a hx of HTN, prediabetes and vertigo who is being seen 11/11/2021 for the evaluation of chest pain at the request of Dr. Clearence Ped. ? ?History of Present Illness:  ? ?[name] presented to [name] ED on 11/10/2021 for evaluation of chest pain which had started earlier in the day. In talking with the patient today, she reports she is typically active at baseline and walks in her neighborhood for exercise. She denies any recent chest pain or dyspnea on exertion with this.  Yesterday, she developed a right sided chest discomfort which radiated down her right arm and occurred after climbing a flight of stairs. Her pain lasted for 5+ hours and was worse with movement of her right arm. The right side of her chest has been diffusely tender to palpation as well. The pain persisted afterwards and was not associated with exertion. No recent orthopnea, PND or pitting edema. She does report having intermittent vertigo for several years and feels like this has acutely worsened. Reports more dizzy spells overnight and feels like the room is spinning. She did receive Meclizine with temporary improvement in her symptoms. ? ?She is unaware of any personal history of HLD, Type II DM, CAD or CHF. No family history of cardiac issues per her report. Says her parents do have hypertension. ? ?Initial labs showed WBC 8.1, Hgb 13.1, platelets 325, Na+ 138, K+ 3.3 and creatinine 0.94. Initial Hs Troponin 4 with repeat values of 9, 9 and 6.  FLP shows total cholesterol 175, triglycerides 77, HDL 33 and LDL 127. CXR with no active cardiopulmonary disease.  CT Head showed no acute intracranial abnormalities.  EKG shows normal sinus rhythm, heart  rate 68 with baseline artifact but no acute ST changes. ? ? ?Past Medical History:  ?Diagnosis Date  ? Hypertension   ? Vertigo   ? ? ?Past Surgical History:  ?Procedure Laterality Date  ? ABDOMINAL HYSTERECTOMY    ? CESAREAN SECTION    ? CHOLECYSTECTOMY N/A 05/26/2018  ? Procedure: LAPAROSCOPIC CHOLECYSTECTOMY;  Surgeon: Virl Cagey, MD;  Location: [facility];  Service: General;  Laterality: N/A;  ?  ? ?Home Medications:  ?Prior to Admission medications   ?Medication Sig Start Date End Date Taking? Authorizing Provider  ?amLODipine (NORVASC) 5 MG tablet TAKE 1 TABLET(5 MG) BY MOUTH DAILY ?Patient taking differently: Take 5 mg by mouth daily. 02/09/21 11/10/21 Yes Ailene Ards, NP  ?Cholecalciferol 125 MCG (5000 UT) TABS Take 2 tablets by mouth daily.   Yes [provider]  ?OVER THE COUNTER MEDICATION Super beets   Yes [provider]  ?fluticasone (FLONASE) 50 MCG/ACT nasal spray Place 1 spray into both nostrils daily. ?Patient not taking: Reported on 11/10/2021 09/11/21   [provider]  ? ? ?Inpatient Medications: ?Scheduled Meds: ? amLODipine  5 mg Oral Daily  ? atorvastatin  10 mg Oral Daily  ? heparin  5,000 Units Subcutaneous Q8H  ? ?Continuous Infusions: ? ?PRN Meds: ?acetaminophen, ondansetron (ZOFRAN) IV ? ?Allergies:   No Known Allergies ? ?Social History:   ?Social History  ? ?Socioeconomic History  ? Marital status: Married  ?  Spouse name: Not on file  ? Number of children: Not  on file  ? Years of education: Not on file  ? Highest education level: Not on file  ?Occupational History  ? Not on file  ?Tobacco Use  ? Smoking status: Never  ? Smokeless tobacco: Never  ?Vaping Use  ? Vaping Use: Never used  ?Substance and Sexual Activity  ? Alcohol use: No  ? Drug use: No  ? Sexual activity: Not on file  ?Other Topics Concern  ? Not on file  ?Social History Narrative  ? Married for 11 years.Lives with husband and son.Works Engineer, production.  ? ?Social Determinants of Health   ? ?Financial Resource Strain: Not on file  ?Food Insecurity: Not on file  ?Transportation Needs: Not on file  ?Physical Activity: Not on file  ?Stress: Not on file  ?Social Connections: Not on file  ?Intimate Partner Violence: Not on file  ?  ?Family History:   ? ?Family History  ?Problem Relation Age of Onset  ? Hypertension Mother   ? Diabetes Father   ? Hypertension Father   ?  ? ?ROS:  ?Please see the history of present illness.  ? ?All other ROS reviewed and negative.    ? ?Physical Exam/Data:  ? ?Vitals:  ? 11/10/21 2100 11/10/21 2200 11/11/21 0007 11/11/21 0538  ?BP: (!) 164/93 115/75 113/63 97/79  ?Pulse: 88 69 63 65  ?Resp: 19 19 17 19   ?Temp:  98.3 ?F (36.8 ?C) 98.1 ?F (36.7 ?C) (!) 97.4 ?F (36.3 ?C)  ?TempSrc:   Oral Oral  ?SpO2: 100% 98% 99% 99%  ?Weight:      ?Height:      ? ? ?Intake/Output Summary (Last 24 hours) at 11/11/2021 0827 ?Last data filed at 11/11/2021 0300 ?Gross per 24 hour  ?Intake 120 ml  ?Output --  ?Net 120 ml  ? ? ?  11/10/2021  ?  1:49 PM 02/04/2021  ?  5:10 PM  ?Last 3 Weights  ?Weight (lbs) 235 lb 236 lb  ?Weight (kg) 106.595 kg 107.049 kg  ?   ?Body mass index is 40.34 kg/m?.  ?General: Pleasant obese female appearing in no acute distress.  ?HEENT: normal ?Neck: no JVD ?Vascular: No carotid bruits; Distal pulses 2+ bilaterally ?Cardiac:  normal S1, S2; RRR; no murmur. Tender to palpation along right pectoral region.  ?Lungs:  clear to auscultation bilaterally, no wheezing, rhonchi or rales  ?Abd: soft, nontender, no hepatomegaly  ?Ext: no pitting edema ?Musculoskeletal:  No deformities, BUE and BLE strength normal and equal ?Skin: warm and dry  ?Neuro:  CNs 2-12 intact, no focal abnormalities noted ?Psych:  Normal affect  ? ?EKG:  The EKG was personally reviewed and demonstrates: NSR, HR 68 with baseline artifact but no acute ST changes. ? ?Telemetry:  Telemetry was personally reviewed and demonstrates: NSR, HR in 50's to 60's.  ? ?Relevant CV Studies: ? ?None on File.  ? ?Laboratory  Data: ? ?High Sensitivity Troponin:   ?Recent Labs  ?Lab 11/10/21 ?1410 11/10/21 ?1634 11/10/21 ?2056 11/10/21 ?2245  ?TROPONINIHS 4 9 9 6   ?   ?Chemistry ?Recent Labs  ?Lab 11/10/21 ?1410 11/11/21 ?[id number]  ?NA 138 139  ?K 3.3* 3.7  ?CL 104 109  ?CO2 27 26  ?GLUCOSE 133* 106*  ?BUN 12 9  ?CREATININE 0.94 0.86  ?CALCIUM 9.0 8.4*  ?GFRNONAA >60 >60  ?ANIONGAP 7 4*  ?  ?Recent Labs  ?Lab 11/11/21 ?[id number]  ?PROT 6.4*  ?ALBUMIN 3.1*  ?AST 20  ?ALT 21  ?ALKPHOS 78  ?BILITOT 0.7  ? ?  Lipids  ?Recent Labs  ?Lab 11/11/21 ?[id number]  ?CHOL 175  ?TRIG 77  ?HDL 33*  ?LDLCALC 127*  ?CHOLHDL 5.3  ?  ?Hematology ?Recent Labs  ?Lab 11/10/21 ?1410 11/11/21 ?[id number]  ?WBC 8.1 6.1  ?RBC 4.80 4.32  ?HGB 13.1 11.8*  ?HCT 40.5 36.6  ?MCV 84.4 84.7  ?MCH 27.3 27.3  ?MCHC 32.3 32.2  ?RDW 14.6 14.6  ?PLT 325 280  ? ?Thyroid No results for input(s): TSH, FREET4 in the last 168 hours.  ?BNPNo results for input(s): BNP, PROBNP in the last 168 hours.  ?DDimer No results for input(s): DDIMER in the last 168 hours. ? ? ?Radiology/Studies:  ?DG Chest 2 View ? ?Result Date: 11/10/2021 ?CLINICAL DATA:  Chest pain. EXAM: CHEST - 2 VIEW COMPARISON:  Chest radiograph 05/21/2018 FINDINGS: The cardiac silhouette remains mildly enlarged. No airspace consolidation, edema, pleural effusion, or pneumothorax is identified. Right upper quadrant abdominal surgical clips are noted. No acute osseous abnormality is seen. IMPRESSION: No active cardiopulmonary disease. Electronically Signed   By: Logan Bores M.D.   On: 11/10/2021 14:34  ? ?CT Head Wo Contrast ? ?Result Date: 11/10/2021 ?CLINICAL DATA:  Headache, new or worsening, neuro deficit (Age 28-49y) EXAM: CT HEAD WITHOUT CONTRAST TECHNIQUE: Contiguous axial images were obtained from the base of the skull through the vertex without intravenous contrast. RADIATION DOSE REDUCTION: This exam was performed according to the departmental dose-optimization program which includes automated exposure control, adjustment of the mA  and/or kV according to patient size and/or use of iterative reconstruction technique. COMPARISON:  2018 FINDINGS: Brain: There is no acute intracranial hemorrhage, mass effect, or edema. Gray-white differentiatio

## 2021-11-11 NOTE — Discharge Summary (Signed)
?Physician Discharge Summary ?  ?Patient: [name] MRN: [id number] DOB: 03/23/1971  ?Admit date:     11/10/2021  ?Discharge date: 11/11/21  ?Discharge Physician: Tyrone Nine  ? ?PCP: Benita Stabile, MD  ? ?Recommendations at discharge:  ?Follow up with cardiology as outpatient to arrange coronary CTA after reassuring work up for right chest pain felt most consistent with musculoskeletal etiology.  ?Follow up with PCP if symptoms persist and for general medical follow up. Atorvastatin 10mg  initiated for LDL 127 in presence of morbid obesity and prediabetes.  ? ?Discharge Diagnoses: ?Principal Problem: ?  Chest pain ?Active Problems: ?  Hypertension ?  Prediabetes ?  Murmur ?  Obesity, Class III, BMI 40-49.9 (morbid obesity) (HCC) ?  Hypokalemia ? ?Hospital Course: ? [name] is a 51 y.o. female with medical history significant of, hypertension, presents the ED with a chief complaint of chest pain.  Patient just got up a flight of stairs at work when she had sudden onset of chest pain that radiated through to her back and down her right arm.  The chest pain itself was located on the right side of her chest and felt sharp and then tight.  She had shortness of breath throughout the whole day although the chest pain did not start until 1 PM.  Patient reports that during her episode of chest pain she felt lightheaded, off balance, and had palpitations.  Patient does have a history of vertigo but she reports this feels different.  Her chest was still hurting on arrival but is improved now.  She is never had pain like this before.  Patient reports that the dyspnea that she has had all day was worse with exertion and better with rest.  She had a normal appetite.  She had no cough no fever.  Patient has no other complaints at this time. ?  ?She is unaware of any personal history of HLD, Type II DM, CAD or CHF. No family history of cardiac issues per her report. Says her parents do have hypertension. ?   ?Initial labs showed WBC 8.1, Hgb 13.1, platelets 325, Na+ 138, K+ 3.3 and creatinine 0.94. Initial Hs Troponin 4 with repeat values of 9, 9 and 6.  FLP shows total cholesterol 175, triglycerides 77, HDL 33 and LDL 127. CXR with no active cardiopulmonary disease.  CT Head showed no acute intracranial abnormalities.  EKG shows normal sinus rhythm, heart rate 68 with baseline artifact but no acute ST changes. Echocardiogram shows normal systolic function.  ? ?Given continuous chest pain with negative troponins, suspect noncardiac etiology.  Cardiology will plan to arrange coronary CTA as an outpatient to rule out obstructive CAD. See below for full details. ? ?Assessment and Plan: ?Chest pain: Features more consistent with MSK etiology and with troponin negative x4, nonischemic ECG, unremarkable echocardiogram, ACS is ruled out.  ?- Given her risk factors, coronary CTA is recommended by cardiology though no further inpatient management is required.  ?- OTC tylenol and/or ibuprofen prn pain. Follow up with PCP if pain not improving.  ? ? ?Morbid obesity: Body mass index is 40.34 kg/m?.  ?- PCP follow up, may qualify for ozempic.  ? ?Prediabetes: HbA1c stable at 6.1%.  ?- Carb-modified diet, consider metformin. No home medications currently ? ?Dyslipidemia: LDL 127, HDL 33.  ?- Atorvastatin 10mg  initiated during hospitalization and will continue pending PCP reevaluation.   ? ?Hypertension ?- Continue amlodipine ? ?Hypokalemia: Resolved with replacement.  ? ?Consultants: Cardiology ?Procedures  performed: Echo  ?Disposition: Home ?Diet recommendation:  ?Discharge Diet Orders (From admission, onward)  ? ?  Start     Ordered  ? 11/11/21 0000  Diet - low sodium heart healthy       ? 11/11/21 1250  ? 11/11/21 0000  Diet Carb Modified       ? 11/11/21 1250  ? ?  ?  ? ?  ? ?Carb modified diet ?DISCHARGE MEDICATION: ?Allergies as of 11/11/2021   ?No Known Allergies ?  ? ?  ?Medication List  ?  ? ?STOP taking these medications    ? ?fluticasone 50 MCG/ACT nasal spray ?Commonly known as: FLONASE ?  ? ?  ? ?TAKE these medications   ? ?amLODipine 5 MG tablet ?Commonly known as: NORVASC ?TAKE 1 TABLET(5 MG) BY MOUTH DAILY ?What changed: See the new instructions. ?  ?atorvastatin 10 MG tablet ?Commonly known as: LIPITOR ?Take 1 tablet (10 mg total) by mouth daily. ?Start taking on: Nov 12, 2021 ?  ?Cholecalciferol 125 MCG (5000 UT) Tabs ?Take 2 tablets by mouth daily. ?  ?OVER THE COUNTER MEDICATION ?Super beets ?  ? ?  ? ? Follow-up Information   ? ? Benita Stabile, MD Follow up.   ?Specialty: Internal Medicine ?Contact information: ?[address] ?Laurell [place]s F ?[place] [address] ?[phone] ? ? ?  ?  ? ?  ?  ? ?  ? ?Discharge Exam: ?Filed Weights  ? 11/10/21 1349  ?Weight: 106.6 kg  ?BP 134/74   Pulse (!) 58   Temp (!) 97.4 ?F (36.3 ?C) (Oral)   Resp 19   Ht 5\' 4"  (1.626 m)   Wt 106.6 kg   SpO2 99%   BMI 40.34 kg/m?   ?No distress ?RRR with II/VI SEM without pitting edema ?Clear, nonlabored ?Tenderness to palpation without visible or palpable deformity of right chest wall.  ? ?Condition at discharge: stable ? ?The results of significant diagnostics from this hospitalization (including imaging, microbiology, ancillary and laboratory) are listed below for reference.  ? ?Imaging Studies: ?DG Chest 2 View ? ?Result Date: 11/10/2021 ?CLINICAL DATA:  Chest pain. EXAM: CHEST - 2 VIEW COMPARISON:  Chest radiograph 05/21/2018 FINDINGS: The cardiac silhouette remains mildly enlarged. No airspace consolidation, edema, pleural effusion, or pneumothorax is identified. Right upper quadrant abdominal surgical clips are noted. No acute osseous abnormality is seen. IMPRESSION: No active cardiopulmonary disease. Electronically Signed   By: 13/04/2018 M.D.   On: 11/10/2021 14:34  ? ?CT Head Wo Contrast ? ?Result Date: 11/10/2021 ?CLINICAL DATA:  Headache, new or worsening, neuro deficit (Age 18-49y) EXAM: CT HEAD WITHOUT CONTRAST TECHNIQUE: Contiguous axial  images were obtained from the base of the skull through the vertex without intravenous contrast. RADIATION DOSE REDUCTION: This exam was performed according to the departmental dose-optimization program which includes automated exposure control, adjustment of the mA and/or kV according to patient size and/or use of iterative reconstruction technique. COMPARISON:  2018 FINDINGS: Brain: There is no acute intracranial hemorrhage, mass effect, or edema. Gray-white differentiation is preserved. There is no extra-axial fluid collection. Ventricles and sulci are within normal limits in size and configuration. Vascular: No hyperdense vessel or unexpected calcification. Skull: Calvarium is unremarkable. Sinuses/Orbits: No acute finding. Other: None. IMPRESSION: No acute intracranial abnormality. Electronically Signed   By: 2019 M.D.   On: 11/10/2021 18:14   ? ?Microbiology: ?Results for orders placed or performed in visit on 12/28/18  ?Novel Coronavirus, NAA ([facility])     Status:  None  ? Collection Time: 12/28/18  9:30 AM  ?Result Value Ref Range Status  ? SARS-CoV-2, NAA Not Detected Not Detected Final  ?  Comment: Testing was performed using the cobas(R) SARS-CoV-2 test. ?This test was developed and its performance characteristics determined ?by [facility]. This test has not been FDA cleared or ?approved. This test has been authorized by FDA under an Emergency Use ?Authorization (EUA). This test is only authorized for the duration of ?time the declaration that circumstances exist justifying the ?authorization of the emergency use of in vitro diagnostic tests for ?detection of SARS-CoV-2 virus and/or diagnosis of COVID-19 infection ?under section 564(b)(1) of the Act, 21 U.S.C. 161WRU-0(A)(5360bbb-3(b)(1), unless ?the authorization is terminated or revoked sooner. ?When diagnostic testing is negative, the possibility of a false ?negative result should be considered in the context of a patient's ?recent exposures and  the presence of clinical signs and symptoms ?consistent with COVID-19. An individual without symptoms of COVID-19 ?and who is not shedding SARS-CoV-2 virus would expect to have a ?negati ve (not detected) result in this as

## 2021-11-11 NOTE — Plan of Care (Signed)
Patient admitted to unit via stretcher from ED, verbal report given. Patient A0x4 ambulated to bathroom, oriented to room and equipment, SCD's applied, hydration offered, patient is accompanied by daughter. Call bell and belongings within reach.  ? ? ?Problem: Education: ?Goal: Knowledge of General Education information will improve ?Description: Including pain rating scale, medication(s)/side effects and non-pharmacologic comfort measures ?Outcome: Progressing ?  ?Problem: Health Behavior/Discharge Planning: ?Goal: Ability to manage health-related needs will improve ?Outcome: Progressing ?  ?Problem: Clinical Measurements: ?Goal: Ability to maintain clinical measurements within normal limits will improve ?Outcome: Progressing ?Goal: Will remain free from infection ?Outcome: Progressing ?Goal: Diagnostic test results will improve ?Outcome: Progressing ?Goal: Respiratory complications will improve ?Outcome: Progressing ?Goal: Cardiovascular complication will be avoided ?Outcome: Progressing ?  ?Problem: Activity: ?Goal: Risk for activity intolerance will decrease ?Outcome: Progressing ?  ?Problem: Nutrition: ?Goal: Adequate nutrition will be maintained ?Outcome: Progressing ?  ?Problem: Coping: ?Goal: Level of anxiety will decrease ?Outcome: Progressing ?  ?Problem: Elimination: ?Goal: Will not experience complications related to bowel motility ?Outcome: Progressing ?Goal: Will not experience complications related to urinary retention ?Outcome: Progressing ?  ?Problem: Pain Managment: ?Goal: General experience of comfort will improve ?Outcome: Progressing ?  ?Problem: Safety: ?Goal: Ability to remain free from injury will improve ?Outcome: Progressing ?  ?Problem: Skin Integrity: ?Goal: Risk for impaired skin integrity will decrease ?Outcome: Progressing ?  ?

## 2021-11-12 DIAGNOSIS — E782 Mixed hyperlipidemia: Secondary | ICD-10-CM | POA: Diagnosis not present

## 2021-11-12 DIAGNOSIS — E559 Vitamin D deficiency, unspecified: Secondary | ICD-10-CM | POA: Diagnosis not present

## 2021-11-12 DIAGNOSIS — I10 Essential (primary) hypertension: Secondary | ICD-10-CM | POA: Diagnosis not present

## 2021-11-12 DIAGNOSIS — R7303 Prediabetes: Secondary | ICD-10-CM | POA: Diagnosis not present

## 2021-11-27 ENCOUNTER — Telehealth (HOSPITAL_COMMUNITY): Payer: Self-pay | Admitting: *Deleted

## 2021-11-27 NOTE — Telephone Encounter (Signed)
Reaching out to patient to offer assistance regarding upcoming cardiac imaging study; pt verbalizes understanding of appt date/time, parking situation and where to check in, pre-test NPO status and medications ordered, and verified current allergies; name and call back number provided for further questions should they arise ? ?Temitayo Covalt RN Navigator Cardiac Imaging ?[facility] ?[phone] office ?[phone] cell ? ?Patient to take 50mg metoprolol tartrate two hours prior to her cardiac CT scan. She is aware to arrive at 4pm. ?

## 2021-11-30 ENCOUNTER — Ambulatory Visit (HOSPITAL_COMMUNITY)
Admission: RE | Admit: 2021-11-30 | Discharge: 2021-11-30 | Disposition: A | Discharge status: Home / Self Care | Payer: BC Managed Care – PPO | Source: Ambulatory Visit | Attending: Cardiology | Admitting: Cardiology

## 2021-11-30 ENCOUNTER — Encounter (HOSPITAL_COMMUNITY): Payer: Self-pay

## 2021-11-30 DIAGNOSIS — R079 Chest pain, unspecified: Secondary | ICD-10-CM | POA: Diagnosis not present

## 2021-11-30 MED ORDER — IOHEXOL 350 MG/ML SOLN
100.0000 mL | Freq: Once | INTRAVENOUS | Status: AC | PRN
  Administered 2021-11-30: 100 mL via INTRAVENOUS

## 2021-11-30 MED ORDER — NITROGLYCERIN 0.4 MG SL SUBL
0.8000 mg | SUBLINGUAL_TABLET | Freq: Once | SUBLINGUAL | Status: AC
  Administered 2021-11-30: 0.8 mg via SUBLINGUAL

## 2021-11-30 MED ORDER — NITROGLYCERIN 0.4 MG SL SUBL
SUBLINGUAL_TABLET | SUBLINGUAL | Status: AC
  Filled 2021-11-30: qty 2

## 2021-12-17 ENCOUNTER — Encounter: Payer: Self-pay | Admitting: *Deleted

## 2022-02-08 ENCOUNTER — Encounter (INDEPENDENT_AMBULATORY_CARE_PROVIDER_SITE_OTHER): Payer: BC Managed Care – PPO | Admitting: Internal Medicine

## 2022-02-15 DIAGNOSIS — E559 Vitamin D deficiency, unspecified: Secondary | ICD-10-CM | POA: Diagnosis not present

## 2022-02-15 DIAGNOSIS — I10 Essential (primary) hypertension: Secondary | ICD-10-CM | POA: Diagnosis not present

## 2022-02-20 DIAGNOSIS — Z0001 Encounter for general adult medical examination with abnormal findings: Secondary | ICD-10-CM | POA: Diagnosis not present

## 2022-05-30 ENCOUNTER — Emergency Department (HOSPITAL_COMMUNITY)
Admission: EM | Admit: 2022-05-30 | Discharge: 2022-05-30 | Disposition: A | Discharge status: Home / Self Care | Payer: BC Managed Care – PPO | Attending: Emergency Medicine | Admitting: Emergency Medicine

## 2022-05-30 ENCOUNTER — Encounter (HOSPITAL_COMMUNITY): Payer: Self-pay

## 2022-05-30 ENCOUNTER — Emergency Department (HOSPITAL_COMMUNITY): Payer: BC Managed Care – PPO

## 2022-05-30 ENCOUNTER — Other Ambulatory Visit: Payer: Self-pay

## 2022-05-30 DIAGNOSIS — K529 Noninfective gastroenteritis and colitis, unspecified: Secondary | ICD-10-CM | POA: Insufficient documentation

## 2022-05-30 DIAGNOSIS — I10 Essential (primary) hypertension: Secondary | ICD-10-CM | POA: Diagnosis not present

## 2022-05-30 DIAGNOSIS — R1012 Left upper quadrant pain: Secondary | ICD-10-CM | POA: Diagnosis not present

## 2022-05-30 DIAGNOSIS — R7309 Other abnormal glucose: Secondary | ICD-10-CM | POA: Diagnosis not present

## 2022-05-30 DIAGNOSIS — D72829 Elevated white blood cell count, unspecified: Secondary | ICD-10-CM | POA: Insufficient documentation

## 2022-05-30 DIAGNOSIS — K760 Fatty (change of) liver, not elsewhere classified: Secondary | ICD-10-CM | POA: Diagnosis not present

## 2022-05-30 LAB — COMPREHENSIVE METABOLIC PANEL
ALT: 21 U/L (ref 0–44)
AST: 22 U/L (ref 15–41)
Albumin: 3.6 g/dL (ref 3.5–5.0)
Alkaline Phosphatase: 96 U/L (ref 38–126)
Anion gap: 7 (ref 5–15)
BUN: 9 mg/dL (ref 6–20)
CO2: 24 mmol/L (ref 22–32)
Calcium: 8.5 mg/dL — ABNORMAL LOW (ref 8.9–10.3)
Chloride: 106 mmol/L (ref 98–111)
Creatinine, Ser: 0.88 mg/dL (ref 0.44–1.00)
GFR, Estimated: >60 mL/min (ref >60)
Glucose, Bld: 149 mg/dL — ABNORMAL HIGH (ref 70–99)
Potassium: 3.6 mmol/L (ref 3.5–5.1)
Sodium: 137 mmol/L (ref 135–145)
Total Bilirubin: 0.5 mg/dL (ref 0.3–1.2)
Total Protein: 7.6 g/dL (ref 6.5–8.1)

## 2022-05-30 LAB — CBC
HCT: 39.8 % (ref 36.0–46.0)
Hemoglobin: 13.1 g/dL (ref 12.0–15.0)
MCH: 27.5 pg (ref 26.0–34.0)
MCHC: 32.9 g/dL (ref 30.0–36.0)
MCV: 83.6 fL (ref 80.0–100.0)
Platelets: 295 10*3/uL (ref 150–400)
RBC: 4.76 MIL/uL (ref 3.87–5.11)
RDW: 14.6 % (ref 11.5–15.5)
WBC: 14 10*3/uL — ABNORMAL HIGH (ref 4.0–10.5)
nRBC: 0 % (ref 0.0–0.2)

## 2022-05-30 LAB — CBG MONITORING, ED: Glucose-Capillary: 183 mg/dL — ABNORMAL HIGH (ref 70–99)

## 2022-05-30 LAB — LIPASE, BLOOD: Lipase: 30 U/L (ref 11–51)

## 2022-05-30 LAB — HCG, QUANTITATIVE, PREGNANCY: hCG, Beta Chain, Quant, S: <1 m[IU]/mL (ref <5)

## 2022-05-30 MED ORDER — SODIUM CHLORIDE 0.9 % IV BOLUS
1000.0000 mL | Freq: Once | INTRAVENOUS | Status: AC
  Administered 2022-05-30: 1000 mL via INTRAVENOUS

## 2022-05-30 MED ORDER — HYDROCODONE-ACETAMINOPHEN 5-325 MG PO TABS: 2.0000 | ORAL_TABLET | ORAL | 0 refills | Status: DC | PRN

## 2022-05-30 MED ORDER — ONDANSETRON HCL 4 MG/2ML IJ SOLN
4.0000 mg | Freq: Once | INTRAMUSCULAR | Status: AC
  Administered 2022-05-30: 4 mg via INTRAVENOUS
  Filled 2022-05-30: qty 2

## 2022-05-30 MED ORDER — IOHEXOL 300 MG/ML  SOLN
100.0000 mL | Freq: Once | INTRAMUSCULAR | Status: AC | PRN
  Administered 2022-05-30: 100 mL via INTRAVENOUS

## 2022-05-30 MED ORDER — HYDROMORPHONE HCL 1 MG/ML IJ SOLN
1.0000 mg | Freq: Once | INTRAMUSCULAR | Status: AC
  Administered 2022-05-30: 1 mg via INTRAVENOUS
  Filled 2022-05-30: qty 1

## 2022-05-30 MED ORDER — ONDANSETRON HCL 4 MG PO TABS: 4.0000 mg | ORAL_TABLET | Freq: Three times a day (TID) | ORAL | 0 refills | Status: DC | PRN

## 2022-05-30 MED ORDER — SODIUM CHLORIDE 0.9 % IV SOLN: INTRAVENOUS | Status: DC

## 2022-05-30 NOTE — ED Provider Notes (Signed)
[facility] EMERGENCY DEPARTMENT Provider Note   CSN: [id number] Arrival date & time: 05/30/22  1026     History  Chief Complaint  Patient presents with   Abdominal Pain    [name] is a 51 y.o. female.  Patient was feeling well until 4 in the morning when she suddenly got crampy abdominal pain more so left upper quadrant left lower quadrant associated with vomiting and multiple episodes of diarrhea.  But no blood in either 1.  No one else sick at home.  Past medical history significant for history of hypertension and vertigo.  Patient's had gallbladder removed.  An abdominal hysterectomy.  Patient has never used tobacco products.       Home Medications Prior to Admission medications   Medication Sig Start Date End Date Taking? Authorizing Provider  HYDROcodone-acetaminophen (NORCO/VICODIN) 5-325 MG tablet Take 2 tablets by mouth every 4 (four) hours as needed. 05/30/22  Yes Vanetta Mulders, MD  ondansetron (ZOFRAN) 4 MG tablet Take 1 tablet (4 mg total) by mouth every 8 (eight) hours as needed for nausea or vomiting. 05/30/22  Yes Vanetta Mulders, MD  amLODipine (NORVASC) 5 MG tablet TAKE 1 TABLET(5 MG) BY MOUTH DAILY Patient taking differently: Take 5 mg by mouth daily. 02/09/21 11/10/21  Elenore Paddy, NP  atorvastatin (LIPITOR) 10 MG tablet Take 1 tablet (10 mg total) by mouth daily. 11/12/21   Tyrone Nine, MD  Cholecalciferol 125 MCG (5000 UT) TABS Take 2 tablets by mouth daily.    [provider]  metoprolol tartrate (LOPRESSOR) 50 MG tablet Take 1 tablet by mouth 2 hours prior to CT Scan 11/11/21   Little Ishikawa, MD  OVER THE COUNTER MEDICATION Super beets    [provider]      Allergies    Patient has no known allergies.    Review of Systems   Review of Systems  Constitutional:  Negative for chills and fever.  HENT:  Negative for rhinorrhea and sore throat.   Eyes:  Negative for visual disturbance.  Respiratory:  Negative for  cough and shortness of breath.   Cardiovascular:  Negative for chest pain and leg swelling.  Gastrointestinal:  Positive for abdominal pain, diarrhea, nausea and vomiting. Negative for blood in stool.  Genitourinary:  Negative for dysuria.  Musculoskeletal:  Negative for back pain and neck pain.  Skin:  Negative for rash.  Neurological:  Negative for dizziness, light-headedness and headaches.  Hematological:  Does not bruise/bleed easily.  Psychiatric/Behavioral:  Negative for confusion.     Physical Exam Updated Vital Signs BP 125/79   Pulse 80   Temp 97.9 F (36.6 C) (Temporal)   Resp (!) 21   Ht 1.626 m (5\' 4" )   Wt 107 kg   SpO2 99%   BMI 40.51 kg/m  Physical Exam Vitals and nursing note reviewed.  Constitutional:      General: She is not in acute distress.    Appearance: She is well-developed. She is not ill-appearing.  HENT:     Head: Normocephalic and atraumatic.  Eyes:     Conjunctiva/sclera: Conjunctivae normal.  Cardiovascular:     Rate and Rhythm: Normal rate and regular rhythm.     Heart sounds: No murmur heard. Pulmonary:     Effort: Pulmonary effort is normal. No respiratory distress.     Breath sounds: Normal breath sounds. No wheezing.  Abdominal:     General: Bowel sounds are increased. There is no distension.  Palpations: Abdomen is soft.     Tenderness: There is abdominal tenderness in the left upper quadrant and left lower quadrant. There is no guarding.     Hernia: No hernia is present.  Musculoskeletal:        General: No swelling.     Cervical back: Neck supple.  Skin:    General: Skin is warm and dry.     Capillary Refill: Capillary refill takes less than 2 seconds.  Neurological:     General: No focal deficit present.     Mental Status: She is alert and oriented to person, place, and time.     Cranial Nerves: No cranial nerve deficit.     Motor: No weakness.  Psychiatric:        Mood and Affect: Mood normal.     ED Results /  Procedures / Treatments   Labs (all labs ordered are listed, but only abnormal results are displayed) Labs Reviewed  COMPREHENSIVE METABOLIC PANEL - Abnormal; Notable for the following components:      Result Value   Glucose, Bld 149 (*)    Calcium 8.5 (*)    All other components within normal limits  CBC - Abnormal; Notable for the following components:   WBC 14.0 (*)    All other components within normal limits  CBG MONITORING, ED - Abnormal; Notable for the following components:   Glucose-Capillary 183 (*)    All other components within normal limits  LIPASE, BLOOD  HCG, QUANTITATIVE, PREGNANCY  URINALYSIS, ROUTINE W REFLEX MICROSCOPIC    EKG None  Radiology CT Abdomen Pelvis W Contrast  Result Date: 05/30/2022 CLINICAL DATA:  Left upper quadrant pain.  Diarrhea. EXAM: CT ABDOMEN AND PELVIS WITH CONTRAST TECHNIQUE: Multidetector CT imaging of the abdomen and pelvis was performed using the standard protocol following bolus administration of intravenous contrast. RADIATION DOSE REDUCTION: This exam was performed according to the departmental dose-optimization program which includes automated exposure control, adjustment of the mA and/or kV according to patient size and/or use of iterative reconstruction technique. CONTRAST:  OMNIPAQUE IOHEXOL 300 MG/ML  SOLN COMPARISON:  None Available. FINDINGS: Lower chest: Dependent atelectasis.  No other abnormalities. Hepatobiliary: Hepatic steatosis. The portal vein is patent. The patient is status post cholecystectomy. Pancreas: Unremarkable. No pancreatic ductal dilatation or surrounding inflammatory changes. Spleen: Normal in size without focal abnormality. Adrenals/Urinary Tract: Adrenal glands are unremarkable. Kidneys are normal, without renal calculi, focal lesion, or hydronephrosis. Bladder is unremarkable. Stomach/Bowel: The stomach is normal. There is a fluid filled mildly prominent loop of jejunum in the left abdomen measuring 2.7  cm. There is no associated wall thickening or adjacent stranding. Surrounding loops are decompressed. There also fluid-filled loops of small bowel in the central and right lower abdomen and upper pelvis without wall thickening. A representative loop on series 2, image 63 measures 2.2 cm. No dilated loops a small dial identified. No evidence of small-bowel obstruction. There is fluid in the cecum and proximal half of the ascending colon. There is some fluid in the sigmoid colon. No pericolonic stranding identified the appendix is normal in appearance. Vascular/Lymphatic: The abdominal aorta is normal in caliber with no dissection. No adenopathy. Reproductive: The patient is status post partial hysterectomy. There is a left adnexal, probably ovarian, cyst measuring up to 4.1 cm. A dominant follicle in the right ovary measures 3.4 cm. Other: No free air or free fluid. There is a fat containing ventral hernia. Musculoskeletal: No acute or significant osseous findings. IMPRESSION: 1.  There are fluid-filled loops of small bowel in the left upper quadrant and in the central and right lower abdomen and upper pelvis without wall thickening or adjacent stranding. There is fluid in the cecum and proximal half of the ascending colon. There is some fluid in the sigmoid colon. These findings are nonspecific but could represent a mild enterocolitis and are consistent with history of diarrhea. 2. Multiple ovarian cysts. Most significant: 4.1 cm left ovarian simple-appearing cyst. No follow-up imaging is recommended. Reference: JACR 2020 Feb;17(2):248-254 3. Hepatic steatosis. 4. Fat containing ventral hernia. Electronically Signed   By: Gerome Sam III M.D.   On: 05/30/2022 14:38    Procedures Procedures    Medications Ordered in ED Medications  0.9 %  sodium chloride infusion ( Intravenous New Bag/Given (Non-Interop) 05/30/22 1322)  sodium chloride 0.9 % bolus 1,000 mL (0 mLs Intravenous Stopped 05/30/22 1510)   ondansetron (ZOFRAN) injection 4 mg (4 mg Intravenous Given 05/30/22 1322)  HYDROmorphone (DILAUDID) injection 1 mg (1 mg Intravenous Given 05/30/22 1322)  iohexol (OMNIPAQUE) 300 MG/ML solution 100 mL (100 mLs Intravenous Contrast Given 05/30/22 1418)    ED Course/ Medical Decision Making/ A&P                           Medical Decision Making Amount and/or Complexity of Data Reviewed Labs: ordered. Radiology: ordered.  Risk Prescription drug management.   With significant improvement with pain medicine antinausea medicine.  Blood sugar 183 lipase not elevated complete metabolic panel without any acute abnormalities electrolytes are normal.  Renal functions normal and liver function test are normal.  Anion gap is 7.  CBC has a leukocytosis white count of 14,000 hemoglobin 13.1.  CT scan of abdomen and pelvis has air-fluid loops of small bowel in left upper quadrant and in the central and right lower abdomen and upper pelvis without wall thickening or adjacent stranding this is fluid in the cecum and the proximal half of the ascending colon there is some fluid in the sigmoid colon these findings are nonspecific and represent a mild enterocolitis and consistent with a history of diarrhea.  Patient also has multiple ovarian cysts.  And a fat-containing ventral hernia.  Symptoms also to fit clinically into her gastroenteritis.  Patient improved with symptomatic treatment.  We will continue Zofran and pain medicine and Imodium A-D over-the-counter.  Suspect that she will be significantly improved in 24 hours.   Final Clinical Impression(s) / ED Diagnoses Final diagnoses:  Gastroenteritis    Rx / DC Orders ED Discharge Orders          Ordered    HYDROcodone-acetaminophen (NORCO/VICODIN) 5-325 MG tablet  Every 4 hours PRN        05/30/22 1526    ondansetron (ZOFRAN) 4 MG tablet  Every 8 hours PRN        05/30/22 1526              Vanetta Mulders, MD 05/30/22 1622

## 2022-05-30 NOTE — ED Notes (Signed)
Pt ambulated independently to the restroom, stating she had soiled herself. On the way back to the room, pt sat on the floor, stating she was feeling weak. Pt was placed in wheelchair and brought back to room. Pt informed she will not be able to get out of bed anymore due to being a fall risk.

## 2022-05-30 NOTE — ED Triage Notes (Signed)
Pt c/o LUQ pain since 0400.  Pt states diarrhea "all day since 0400" as well as emesis.

## 2022-05-30 NOTE — Discharge Instructions (Signed)
Work-up and CT scan consistent with a viral gastroenteritis.  Would expect improvement over the next 24 hours.  Return for any new or worse symptoms.  Take the hydrocodone as directed for pain control.  And take the Zofran as needed for the nausea.  And also can take over-the-counter Imodium A-D for the diarrhea if not resolved by tomorrow.

## 2022-06-28 DIAGNOSIS — E559 Vitamin D deficiency, unspecified: Secondary | ICD-10-CM | POA: Diagnosis not present

## 2022-06-28 DIAGNOSIS — I10 Essential (primary) hypertension: Secondary | ICD-10-CM | POA: Diagnosis not present

## 2022-06-30 DIAGNOSIS — B9689 Other specified bacterial agents as the cause of diseases classified elsewhere: Secondary | ICD-10-CM | POA: Diagnosis not present

## 2022-06-30 DIAGNOSIS — E1165 Type 2 diabetes mellitus with hyperglycemia: Secondary | ICD-10-CM | POA: Diagnosis not present

## 2022-06-30 DIAGNOSIS — J019 Acute sinusitis, unspecified: Secondary | ICD-10-CM | POA: Diagnosis not present

## 2022-06-30 DIAGNOSIS — I10 Essential (primary) hypertension: Secondary | ICD-10-CM | POA: Diagnosis not present

## 2022-06-30 DIAGNOSIS — E782 Mixed hyperlipidemia: Secondary | ICD-10-CM | POA: Diagnosis not present

## 2022-06-30 DIAGNOSIS — E559 Vitamin D deficiency, unspecified: Secondary | ICD-10-CM | POA: Diagnosis not present

## 2022-08-06 DIAGNOSIS — R92323 Mammographic fibroglandular density, bilateral breasts: Secondary | ICD-10-CM | POA: Diagnosis not present

## 2022-08-06 DIAGNOSIS — Z1231 Encounter for screening mammogram for malignant neoplasm of breast: Secondary | ICD-10-CM | POA: Diagnosis not present

## 2022-09-09 ENCOUNTER — Encounter: Payer: Self-pay | Admitting: Radiology

## 2022-10-26 DIAGNOSIS — I10 Essential (primary) hypertension: Secondary | ICD-10-CM | POA: Diagnosis not present

## 2022-10-26 DIAGNOSIS — E559 Vitamin D deficiency, unspecified: Secondary | ICD-10-CM | POA: Diagnosis not present

## 2022-11-02 DIAGNOSIS — E1169 Type 2 diabetes mellitus with other specified complication: Secondary | ICD-10-CM | POA: Diagnosis not present

## 2022-11-02 DIAGNOSIS — I10 Essential (primary) hypertension: Secondary | ICD-10-CM | POA: Diagnosis not present

## 2022-11-02 DIAGNOSIS — E782 Mixed hyperlipidemia: Secondary | ICD-10-CM | POA: Diagnosis not present

## 2022-11-02 DIAGNOSIS — M25512 Pain in left shoulder: Secondary | ICD-10-CM | POA: Diagnosis not present

## 2022-11-02 DIAGNOSIS — E559 Vitamin D deficiency, unspecified: Secondary | ICD-10-CM | POA: Diagnosis not present

## 2022-12-22 DIAGNOSIS — Z6841 Body Mass Index (BMI) 40.0 and over, adult: Secondary | ICD-10-CM | POA: Diagnosis not present

## 2022-12-22 DIAGNOSIS — H11431 Conjunctival hyperemia, right eye: Secondary | ICD-10-CM | POA: Diagnosis not present

## 2022-12-22 DIAGNOSIS — R030 Elevated blood-pressure reading, without diagnosis of hypertension: Secondary | ICD-10-CM | POA: Diagnosis not present

## 2022-12-22 DIAGNOSIS — G43019 Migraine without aura, intractable, without status migrainosus: Secondary | ICD-10-CM | POA: Diagnosis not present

## 2023-03-16 ENCOUNTER — Encounter: Payer: Self-pay | Admitting: Emergency Medicine

## 2023-03-16 ENCOUNTER — Ambulatory Visit
Admission: EM | Admit: 2023-03-16 | Discharge: 2023-03-16 | Disposition: A | Discharge status: Home / Self Care | Payer: BC Managed Care – PPO

## 2023-03-16 ENCOUNTER — Other Ambulatory Visit: Payer: Self-pay

## 2023-03-16 DIAGNOSIS — R208 Other disturbances of skin sensation: Secondary | ICD-10-CM

## 2023-03-16 MED ORDER — PREDNISONE 20 MG PO TABS: 40.0000 mg | ORAL_TABLET | Freq: Every day | ORAL | 0 refills | Status: AC

## 2023-03-16 NOTE — Discharge Instructions (Signed)
Take medication as prescribed. May take over-the-counter Tylenol as needed for pain, fever, or general discomfort. Soak your feet in cool water or lukewarm water for at least 10 to 15 minutes to help with burning sensation. Make sure you are wearing shoes with good insole and support when you are at work.  Continue wearing your compression socks when you are at work. Elevate the legs and feet above the level of the heart is much as possible. Massage the feet to help decrease symptoms. Follow-up with your primary care physician as scheduled this month. Follow-up as needed.

## 2023-03-16 NOTE — ED Triage Notes (Signed)
Pt reports bilateral foot burning sensation x1 month. Denies any known injury. Gait steady.

## 2023-03-16 NOTE — ED Provider Notes (Signed)
[facility]    CSN: [id number] Arrival date & time: 03/16/23  1731      History   Chief Complaint Chief Complaint  Patient presents with   Foot Pain    HPI [name] is a 52 y.o. female.   The history is provided by the patient.   The patient presents for complaints of "burning" sensation in the bilateral feet.  Symptoms have been present for the past month.  Patient denies injury or trauma, swelling, numbness, tingling, or radiation of pain.  Patient states she feels the burning mostly on the "outside" of her feet.  She states that the pain has been constant, and she cannot determine what makes her symptoms better or worse.  She reports she has tried ibuprofen for her symptoms with minimal relief.  Patient reports she does have a history of prediabetes and hypertension.  Patient states she is scheduled to see her PCP this month.  Past Medical History:  Diagnosis Date   Hypertension    Vertigo     Patient Active Problem List   Diagnosis Date Noted   Chest pain 11/10/2021   Obesity, Class III, BMI 40-49.9 (morbid obesity) (HCC) 11/10/2021   Hypokalemia 11/10/2021   Murmur 02/04/2021   Hyperlipidemia 01/28/2020   Prediabetes 01/28/2020   Chronic pain of left knee 10/19/2019   Hypertension 06/20/2019   Vitamin D deficiency 06/20/2019   Tension headache 06/20/2019   Intractable vomiting with nausea 05/21/2018   Calculus of gallbladder without cholecystitis without obstruction 05/21/2018   Transaminitis 05/21/2018   Biliary colic     Past Surgical History:  Procedure Laterality Date   ABDOMINAL HYSTERECTOMY     CESAREAN SECTION     CHOLECYSTECTOMY N/A 05/26/2018   Procedure: LAPAROSCOPIC CHOLECYSTECTOMY;  Surgeon: Lucretia Roers, MD;  Location: [facility];  Service: General;  Laterality: N/A;    OB History   No obstetric history on file.      Home Medications    Prior to Admission medications   Medication Sig Start Date End Date Taking?  Authorizing Provider  hydrochlorothiazide (HYDRODIURIL) 12.5 MG tablet Take 12.5 mg by mouth daily.   Yes [provider]  olmesartan (BENICAR) 20 MG tablet Take 20 mg by mouth daily.   Yes [provider]  predniSONE (DELTASONE) 20 MG tablet Take 2 tablets (40 mg total) by mouth daily with breakfast for 5 days. 03/16/23 03/21/23 Yes Gladyse Corvin-Warren, Sadie Haber, NP  amLODipine (NORVASC) 5 MG tablet TAKE 1 TABLET(5 MG) BY MOUTH DAILY Patient taking differently: Take 5 mg by mouth daily. 02/09/21 11/10/21  Elenore Paddy, NP  atorvastatin (LIPITOR) 10 MG tablet Take 1 tablet (10 mg total) by mouth daily. 11/12/21   Tyrone Nine, MD  Cholecalciferol 125 MCG (5000 UT) TABS Take 2 tablets by mouth daily.    [provider]  HYDROcodone-acetaminophen (NORCO/VICODIN) 5-325 MG tablet Take 2 tablets by mouth every 4 (four) hours as needed. 05/30/22   Vanetta Mulders, MD  metoprolol tartrate (LOPRESSOR) 50 MG tablet Take 1 tablet by mouth 2 hours prior to CT Scan 11/11/21   Little Ishikawa, MD  ondansetron (ZOFRAN) 4 MG tablet Take 1 tablet (4 mg total) by mouth every 8 (eight) hours as needed for nausea or vomiting. 05/30/22   Vanetta Mulders, MD  OVER THE COUNTER MEDICATION Super beets    [provider]    Family History Family History  Problem Relation Age of Onset   Hypertension Mother  Diabetes Father    Hypertension Father     Social History Social History   Tobacco Use   Smoking status: Never   Smokeless tobacco: Never  Vaping Use   Vaping status: Never Used  Substance Use Topics   Alcohol use: No   Drug use: No     Allergies   Patient has no known allergies.   Review of Systems Review of Systems Per HPI  Physical Exam Triage Vital Signs ED Triage Vitals  Encounter Vitals Group     BP 03/16/23 1836 (!) 161/83     Systolic BP Percentile --      Diastolic BP Percentile --      Pulse Rate 03/16/23 1836 61     Resp 03/16/23 1836 20      Temp 03/16/23 1836 98.1 F (36.7 C)     Temp Source 03/16/23 1836 Oral     SpO2 03/16/23 1836 98 %     Weight --      Height --      Head Circumference --      Peak Flow --      Pain Score 03/16/23 1832 10     Pain Loc --      Pain Education --      Exclude from Growth Chart --    No data found.  Updated Vital Signs BP (!) 161/83 (BP Location: Right Arm)   Pulse 61   Temp 98.1 F (36.7 C) (Oral)   Resp 20   SpO2 98%   Visual Acuity Right Eye Distance:   Left Eye Distance:   Bilateral Distance:    Right Eye Near:   Left Eye Near:    Bilateral Near:     Physical Exam Vitals and nursing note reviewed.  Constitutional:      General: She is not in acute distress.    Appearance: Normal appearance.  HENT:     Head: Normocephalic.  Eyes:     Extraocular Movements: Extraocular movements intact.     Conjunctiva/sclera: Conjunctivae normal.     Pupils: Pupils are equal, round, and reactive to light.  Cardiovascular:     Rate and Rhythm: Normal rate and regular rhythm.     Pulses: Normal pulses.     Heart sounds: Normal heart sounds.  Pulmonary:     Effort: Pulmonary effort is normal. No respiratory distress.     Breath sounds: Normal breath sounds. No stridor. No wheezing, rhonchi or rales.  Musculoskeletal:     Cervical back: Normal range of motion.     Right foot: Normal range of motion and normal capillary refill. Tenderness (Lateral aspect of right foot.) present. No swelling or deformity. Normal pulse.     Left foot: Normal range of motion and normal capillary refill. Tenderness (Lateral aspect of left foot.) present. No swelling or deformity. Normal pulse.  Feet:     Right foot:     Skin integrity: Skin integrity normal.     Toenail Condition: Right toenails are normal.     Left foot:     Skin integrity: Skin integrity normal.     Toenail Condition: Left toenails are normal.  Lymphadenopathy:     Cervical: No cervical adenopathy.  Skin:    General: Skin is  warm and dry.  Neurological:     General: No focal deficit present.     Mental Status: She is alert and oriented to person, place, and time.  Psychiatric:  Mood and Affect: Mood normal.        Behavior: Behavior normal.      UC Treatments / Results  Labs (all labs ordered are listed, but only abnormal results are displayed) Labs Reviewed - No data to display  EKG   Radiology No results found.  Procedures Procedures (including critical care time)  Medications Ordered in UC Medications - No data to display  Initial Impression / Assessment and Plan / UC Course  I have reviewed the triage vital signs and the nursing notes.  Pertinent labs & imaging results that were available during my care of the patient were reviewed by me and considered in my medical decision making (see chart for details).  The patient is well-appearing, she is in no acute distress, vital signs are stable.  Patient appears to have burning feet syndrome.  Will treat with prednisone 40 mg for the next 5 days.  Supportive care recommendations were provided and discussed with the patient to include over-the-counter Tylenol for pain or discomfort, continuing use of her compression socks, foot massages, and soaking her feet in cool water while symptoms persist.  Patient is scheduled to see her PCP this month.  Patient is in agreement with this plan of care and verbalizes understanding.  All questions were answered.  Patient stable for discharge.  Final Clinical Impressions(s) / UC Diagnoses   Final diagnoses:  Burning sensation of feet     Discharge Instructions      Take medication as prescribed. May take over-the-counter Tylenol as needed for pain, fever, or general discomfort. Soak your feet in cool water or lukewarm water for at least 10 to 15 minutes to help with burning sensation. Make sure you are wearing shoes with good insole and support when you are at work.  Continue wearing your  compression socks when you are at work. Elevate the legs and feet above the level of the heart is much as possible. Massage the feet to help decrease symptoms. Follow-up with your primary care physician as scheduled this month. Follow-up as needed.     ED Prescriptions     Medication Sig Dispense Auth. Provider   predniSONE (DELTASONE) 20 MG tablet Take 2 tablets (40 mg total) by mouth daily with breakfast for 5 days. 10 tablet Ciena Sampley-Warren, Sadie Haber, NP      PDMP not reviewed this encounter.   Abran Cantor, NP 03/16/23 1902

## 2023-03-18 DIAGNOSIS — E559 Vitamin D deficiency, unspecified: Secondary | ICD-10-CM | POA: Diagnosis not present

## 2023-03-18 DIAGNOSIS — I10 Essential (primary) hypertension: Secondary | ICD-10-CM | POA: Diagnosis not present

## 2023-03-25 DIAGNOSIS — E782 Mixed hyperlipidemia: Secondary | ICD-10-CM | POA: Diagnosis not present

## 2023-03-25 DIAGNOSIS — R010 Benign and innocent cardiac murmurs: Secondary | ICD-10-CM | POA: Diagnosis not present

## 2023-03-25 DIAGNOSIS — E1169 Type 2 diabetes mellitus with other specified complication: Secondary | ICD-10-CM | POA: Diagnosis not present

## 2023-03-25 DIAGNOSIS — I10 Essential (primary) hypertension: Secondary | ICD-10-CM | POA: Diagnosis not present

## 2023-03-25 DIAGNOSIS — Z23 Encounter for immunization: Secondary | ICD-10-CM | POA: Diagnosis not present

## 2023-03-30 ENCOUNTER — Encounter: Payer: Self-pay | Admitting: *Deleted

## 2023-04-26 ENCOUNTER — Other Ambulatory Visit (HOSPITAL_COMMUNITY): Payer: Self-pay | Admitting: Family Medicine

## 2023-04-26 ENCOUNTER — Ambulatory Visit (HOSPITAL_COMMUNITY)
Admission: RE | Admit: 2023-04-26 | Discharge: 2023-04-26 | Disposition: A | Discharge status: Home / Self Care | Payer: BC Managed Care – PPO | Source: Ambulatory Visit | Attending: Family Medicine | Admitting: Family Medicine

## 2023-04-26 DIAGNOSIS — R42 Dizziness and giddiness: Secondary | ICD-10-CM | POA: Diagnosis not present

## 2023-04-26 DIAGNOSIS — R202 Paresthesia of skin: Secondary | ICD-10-CM | POA: Diagnosis not present

## 2023-04-26 DIAGNOSIS — M47812 Spondylosis without myelopathy or radiculopathy, cervical region: Secondary | ICD-10-CM | POA: Diagnosis not present

## 2023-04-26 DIAGNOSIS — G5603 Carpal tunnel syndrome, bilateral upper limbs: Secondary | ICD-10-CM | POA: Diagnosis not present

## 2023-04-26 DIAGNOSIS — M4802 Spinal stenosis, cervical region: Secondary | ICD-10-CM | POA: Diagnosis not present

## 2023-04-26 DIAGNOSIS — M5030 Other cervical disc degeneration, unspecified cervical region: Secondary | ICD-10-CM | POA: Diagnosis not present

## 2023-05-02 DIAGNOSIS — G5603 Carpal tunnel syndrome, bilateral upper limbs: Secondary | ICD-10-CM | POA: Insufficient documentation

## 2023-05-09 DIAGNOSIS — G5603 Carpal tunnel syndrome, bilateral upper limbs: Secondary | ICD-10-CM | POA: Diagnosis not present

## 2023-05-26 DIAGNOSIS — G5603 Carpal tunnel syndrome, bilateral upper limbs: Secondary | ICD-10-CM | POA: Diagnosis not present

## 2023-06-30 DIAGNOSIS — G5603 Carpal tunnel syndrome, bilateral upper limbs: Secondary | ICD-10-CM | POA: Diagnosis not present

## 2023-07-23 ENCOUNTER — Encounter (HOSPITAL_COMMUNITY): Payer: Self-pay

## 2023-07-23 ENCOUNTER — Ambulatory Visit (HOSPITAL_COMMUNITY)
Admission: EM | Admit: 2023-07-23 | Discharge: 2023-07-23 | Disposition: A | Discharge status: Home / Self Care | Payer: BC Managed Care – PPO

## 2023-07-23 DIAGNOSIS — S60552A Superficial foreign body of left hand, initial encounter: Secondary | ICD-10-CM | POA: Diagnosis not present

## 2023-07-23 DIAGNOSIS — S60551A Superficial foreign body of right hand, initial encounter: Secondary | ICD-10-CM

## 2023-07-23 NOTE — ED Triage Notes (Signed)
 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 Patient states she thinks she has metal shavings in her hands because when she rubs she has pain.   Patient states she has used vinegar soaks, baking soda, and H2O2

## 2023-07-23 NOTE — ED Provider Notes (Signed)
 [facility]    CSN: [id number] Arrival date & time: 07/23/23  1144      History   Chief Complaint Chief Complaint  Patient presents with   metal shavings    HPI [name] is a 53 y.o. female.   52 year old female who presents to urgent care with complaints of possible metal in numerous areas of her hands bilateral.  She reports on Wednesday she was putting metal blades that were wrapped away and was not wearing gloves.  She felt that she may have gotten slivers of metal into her hand during this but did not develop symptoms until 2 days later.  She reports that something similar has happened in the past and that she went to her doctor at work and they had a device to get the metal out.  She reports that the areas are very tender and that they burn when she washes her hands.  She also has been putting peroxide on the areas and reports that they bubble.  She believes that she is up-to-date on her tetanus.  She has not had fevers or chills associated with this.     Past Medical History:  Diagnosis Date   Hypertension    Vertigo     Patient Active Problem List   Diagnosis Date Noted   Chest pain 11/10/2021   Obesity, Class III, BMI 40-49.9 (morbid obesity) (HCC) 11/10/2021   Hypokalemia 11/10/2021   Murmur 02/04/2021   Hyperlipidemia 01/28/2020   Prediabetes 01/28/2020   Chronic pain of left knee 10/19/2019   Hypertension 06/20/2019   Vitamin D  deficiency 06/20/2019   Tension headache 06/20/2019   Intractable vomiting with nausea 05/21/2018   Calculus of gallbladder without cholecystitis without obstruction 05/21/2018   Transaminitis 05/21/2018   Biliary colic     Past Surgical History:  Procedure Laterality Date   ABDOMINAL HYSTERECTOMY     CESAREAN SECTION     CHOLECYSTECTOMY N/A 05/26/2018   Procedure: LAPAROSCOPIC CHOLECYSTECTOMY;  Surgeon: Kallie Manuelita BROCKS, MD;  Location: [facility];  Service: General;  Laterality: N/A;    OB History   No  obstetric history on file.      Home Medications    Prior to Admission medications   Medication Sig Start Date End Date Taking? Authorizing Provider  amLODipine  (NORVASC ) 5 MG tablet TAKE 1 TABLET(5 MG) BY MOUTH DAILY Patient taking differently: Take 5 mg by mouth daily. 02/09/21 11/10/21  Elnor Lauraine BRAVO, NP  atorvastatin  (LIPITOR) 10 MG tablet Take 1 tablet (10 mg total) by mouth daily. 11/12/21   Bryn Bernardino NOVAK, MD  Cholecalciferol 125 MCG (5000 UT) TABS Take 2 tablets by mouth daily.    [provider]  hydrochlorothiazide  (HYDRODIURIL ) 12.5 MG tablet Take 12.5 mg by mouth daily.    [provider]  HYDROcodone -acetaminophen  (NORCO/VICODIN) 5-325 MG tablet Take 2 tablets by mouth every 4 (four) hours as needed. 05/30/22   Zackowski, Scott, MD  metoprolol  tartrate (LOPRESSOR ) 50 MG tablet Take 1 tablet by mouth 2 hours prior to CT Scan 11/11/21   Kate Lonni CROME, MD  olmesartan (BENICAR) 20 MG tablet Take 20 mg by mouth daily.    [provider]  ondansetron  (ZOFRAN ) 4 MG tablet Take 1 tablet (4 mg total) by mouth every 8 (eight) hours as needed for nausea or vomiting. 05/30/22   Zackowski, Scott, MD  OVER THE COUNTER MEDICATION Super beets    [provider]    Family History Family History  Problem Relation  Age of Onset   Hypertension Mother    Diabetes Father    Hypertension Father     Social History Social History   Tobacco Use   Smoking status: Never   Smokeless tobacco: Never  Vaping Use   Vaping status: Never Used  Substance Use Topics   Alcohol use: No   Drug use: No     Allergies   Patient has no known allergies.   Review of Systems Review of Systems   Physical Exam Triage Vital Signs ED Triage Vitals [07/23/23 1157]  Encounter Vitals Group     BP 134/87     Systolic BP Percentile      Diastolic BP Percentile      Pulse Rate 82     Resp 16     Temp 98.1 F (36.7 C)     Temp Source Oral     SpO2 97 %      Weight      Height      Head Circumference      Peak Flow      Pain Score 7     Pain Loc      Pain Education      Exclude from Growth Chart    No data found.  Updated Vital Signs BP 134/87 (BP Location: Left Arm)   Pulse 82   Temp 98.1 F (36.7 C) (Oral)   Resp 16   SpO2 97%   Visual Acuity Right Eye Distance:   Left Eye Distance:   Bilateral Distance:    Right Eye Near:   Left Eye Near:    Bilateral Near:     Physical Exam Vitals and nursing note reviewed.  Constitutional:      General: She is not in acute distress.    Appearance: She is well-developed.  HENT:     Head: Normocephalic and atraumatic.  Eyes:     Conjunctiva/sclera: Conjunctivae normal.  Cardiovascular:     Rate and Rhythm: Normal rate and regular rhythm.     Heart sounds: No murmur heard. Pulmonary:     Effort: Pulmonary effort is normal. No respiratory distress.  Musculoskeletal:        General: No swelling.     Cervical back: Neck supple.  Skin:    General: Skin is warm and dry.     Capillary Refill: Capillary refill takes less than 2 seconds.     Comments: Flaking skin bilateral, fissure on fingers, unable to visualize metal in any area.   Neurological:     Mental Status: She is alert.  Psychiatric:        Mood and Affect: Mood normal.      UC Treatments / Results  Labs (all labs ordered are listed, but only abnormal results are displayed) Labs Reviewed - No data to display  EKG   Radiology No results found.  Procedures Procedures (including critical care time)  Medications Ordered in UC Medications - No data to display  Initial Impression / Assessment and Plan / UC Course  I have reviewed the triage vital signs and the nursing notes.  Pertinent labs & imaging results that were available during my care of the patient were reviewed by me and considered in my medical decision making (see chart for details).     Superficial foreign body of left hand, initial  encounter  Foreign body of right hand, initial encounter    I explained to the patient that as I am unable to visualize any metal  pieces, I would not attempt to extract anything especially given that there are numerous spots that she is complaining about.  I offered referral to a hand surgeon which she declined.  She did ask about tetanus and relates that she has had a tetanus recently and I related to her that if this has been within the last 5 years that she did not need to have a repeat tetanus.  I also related that there is a possibility that some of the symptoms may be associated with severe dry skin as she does have flaking around her fingers as well as some fissures but the patient reports that this is not likely to be the case as the symptoms did not start until 2 to 3 days after she was working with the metal blades.  The patient reports that she would just like to leave and follow-up with her primary care physician. Final Clinical Impressions(s) / UC Diagnoses   Final diagnoses:  None   Discharge Instructions   None    ED Prescriptions   None    PDMP not reviewed this encounter.   Teresa Almarie LABOR, PA-C 07/23/23 1258

## 2023-07-23 NOTE — Discharge Instructions (Addendum)
 Follow up with hand surgeon or PCP

## 2023-08-10 DIAGNOSIS — Z1231 Encounter for screening mammogram for malignant neoplasm of breast: Secondary | ICD-10-CM | POA: Diagnosis not present

## 2023-08-10 DIAGNOSIS — R92323 Mammographic fibroglandular density, bilateral breasts: Secondary | ICD-10-CM | POA: Diagnosis not present

## 2023-08-24 DIAGNOSIS — I10 Essential (primary) hypertension: Secondary | ICD-10-CM | POA: Diagnosis not present

## 2023-08-24 DIAGNOSIS — E559 Vitamin D deficiency, unspecified: Secondary | ICD-10-CM | POA: Diagnosis not present

## 2023-08-24 DIAGNOSIS — E1169 Type 2 diabetes mellitus with other specified complication: Secondary | ICD-10-CM | POA: Diagnosis not present

## 2023-09-29 ENCOUNTER — Encounter: Payer: Self-pay | Admitting: *Deleted

## 2023-10-13 DIAGNOSIS — Z0000 Encounter for general adult medical examination without abnormal findings: Secondary | ICD-10-CM | POA: Diagnosis not present

## 2023-10-13 DIAGNOSIS — E1169 Type 2 diabetes mellitus with other specified complication: Secondary | ICD-10-CM | POA: Diagnosis not present

## 2023-10-13 DIAGNOSIS — I10 Essential (primary) hypertension: Secondary | ICD-10-CM | POA: Diagnosis not present

## 2023-10-13 DIAGNOSIS — Z0001 Encounter for general adult medical examination with abnormal findings: Secondary | ICD-10-CM | POA: Diagnosis not present

## 2023-10-13 DIAGNOSIS — E1142 Type 2 diabetes mellitus with diabetic polyneuropathy: Secondary | ICD-10-CM | POA: Diagnosis not present

## 2023-10-13 DIAGNOSIS — Z23 Encounter for immunization: Secondary | ICD-10-CM | POA: Diagnosis not present

## 2023-10-18 ENCOUNTER — Encounter: Payer: Self-pay | Admitting: *Deleted

## 2023-12-08 IMAGING — CT CT HEAD W/O CM
3 of 4 series · 16 of 47 positions shown, 19 images · non-contrast
Comparison: 1158

CLINICAL DATA: Headache, new or worsening, neuro deficit (Age
18-49y)



[Series 2: head w o · axial · 0.42mm/px · z∈[-12,+118]mm · 10 of 32 slices shown, 13 images]
[im 3/32  brain]
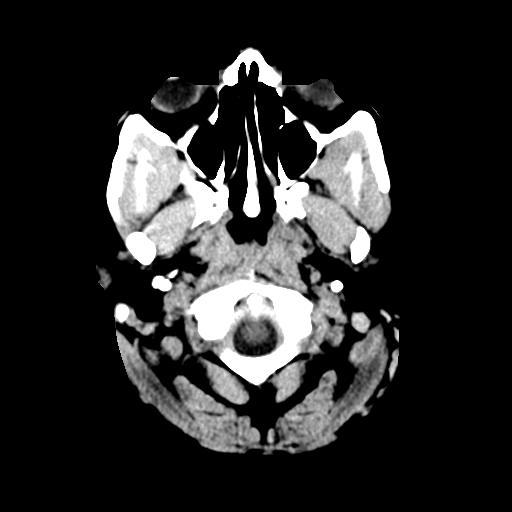
[im 3/32  bone]
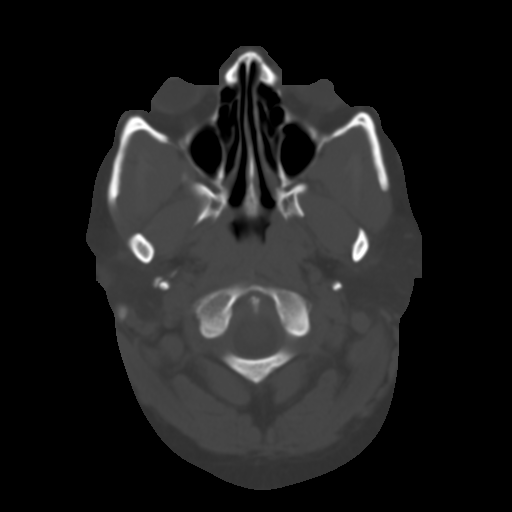
[im 5/32  brain]
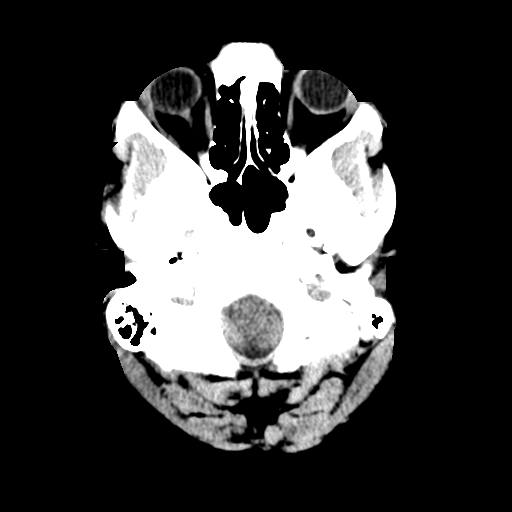
[im 9/32  brain]
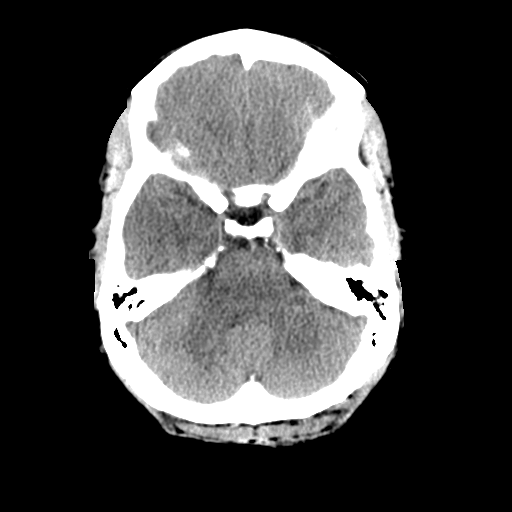
[im 12/32  brain]
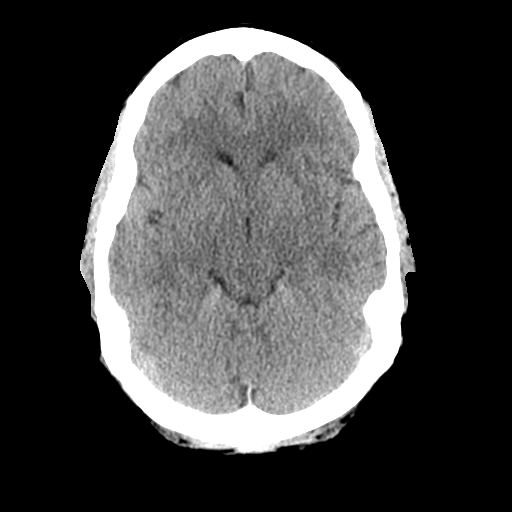
[im 14/32  brain]
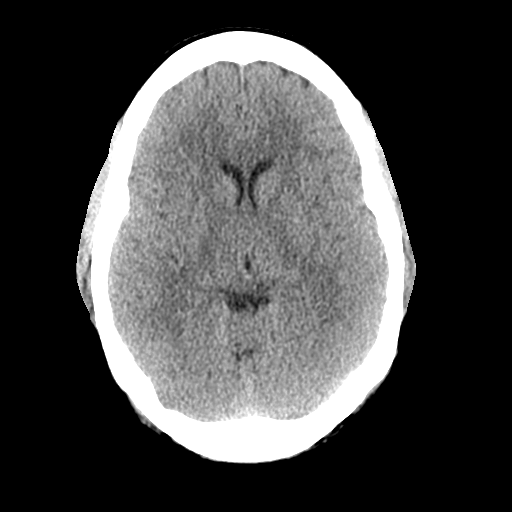
[im 14/32  bone]
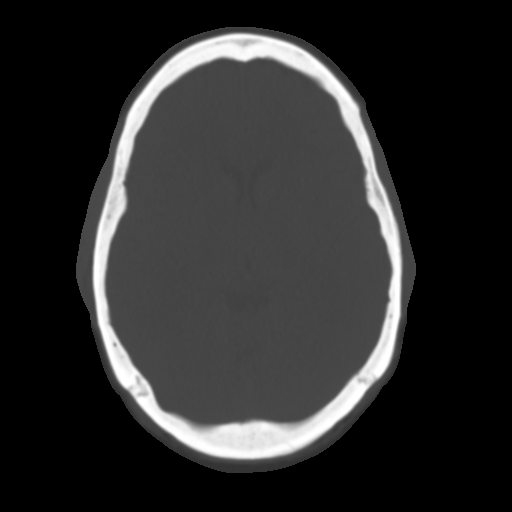
[im 18/32  brain]
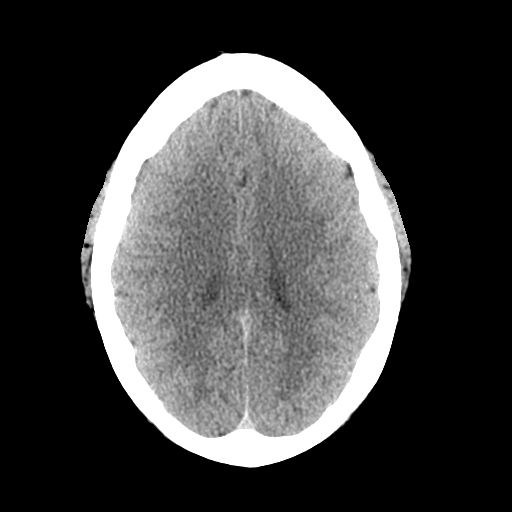
[im 20/32  brain]
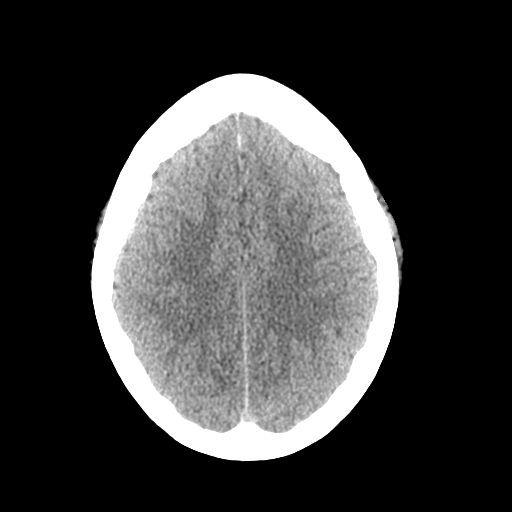
[im 23/32  brain]
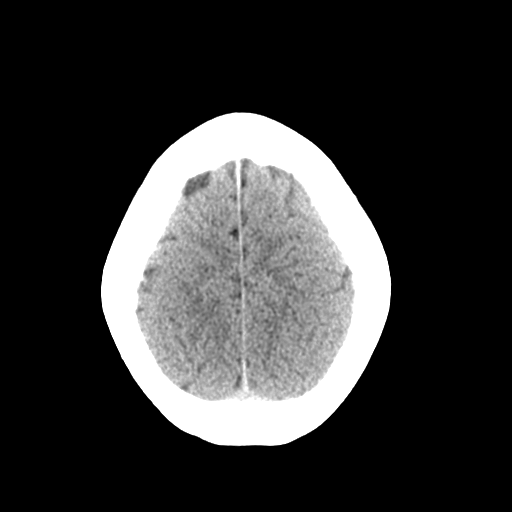
[im 27/32  brain]
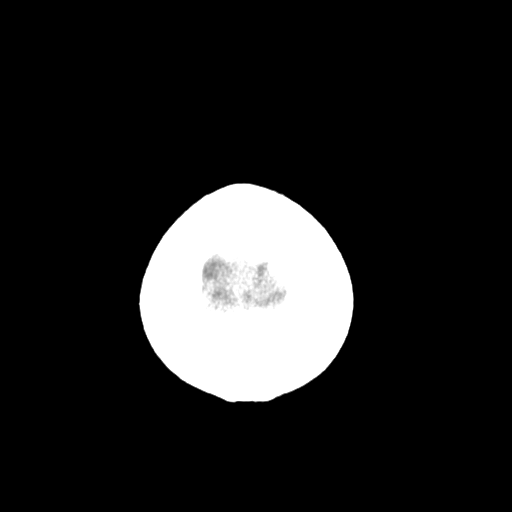
[im 27/32  bone]
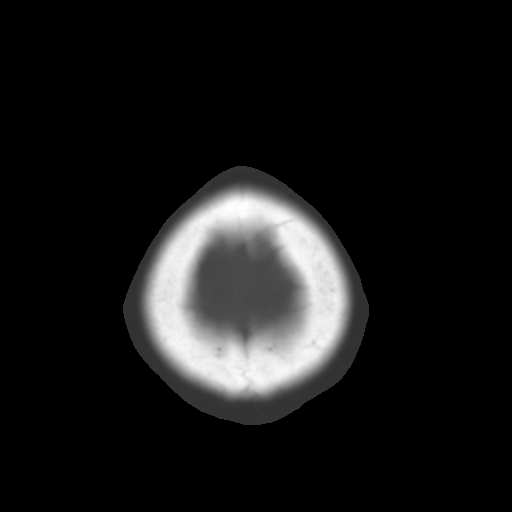
[im 29/32  brain]
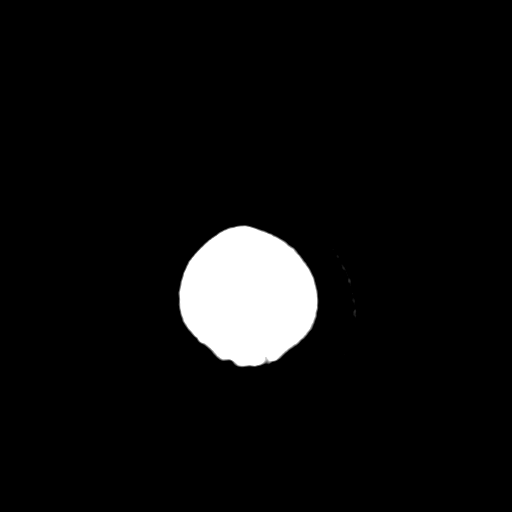

[Series 4: coronal soft · coronal · 0.30mm/px · 3 of 67 slices shown]
[im 23/67  brain]
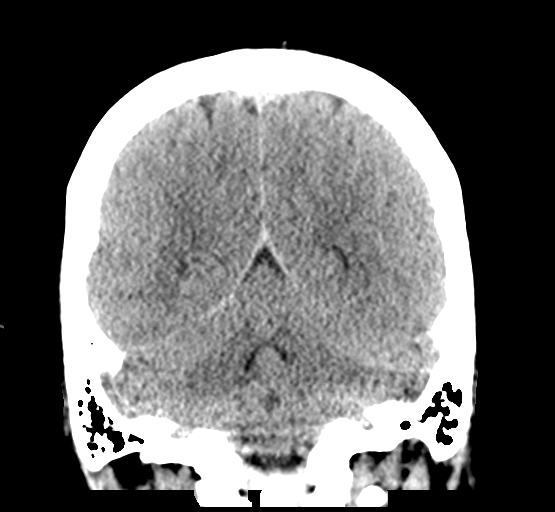
[im 30/67  brain]
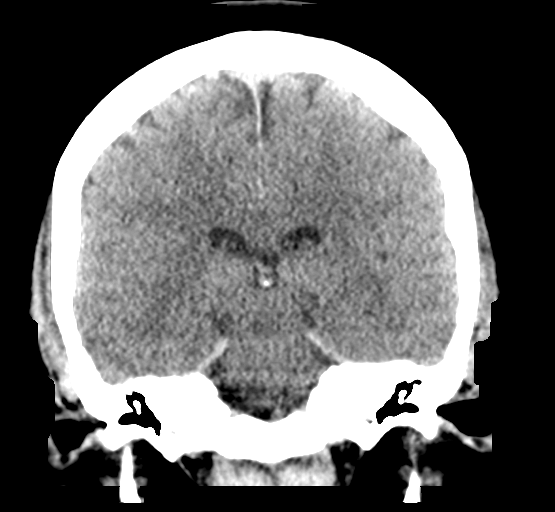
[im 37/67  brain]
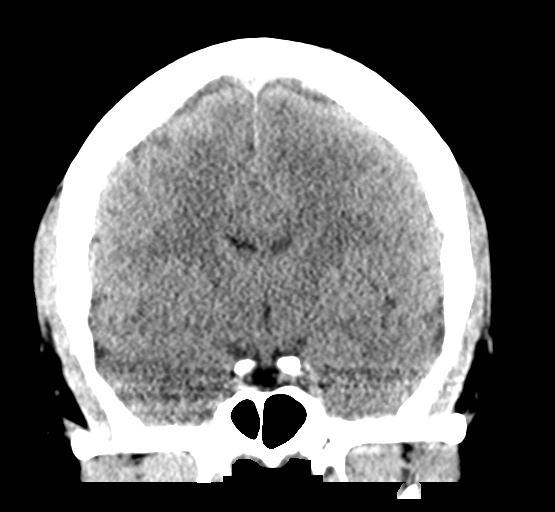

[Series 5: sagittal soft · sagittal · 0.33mm/px · 3 of 55 slices shown]
[im 19/55  brain]
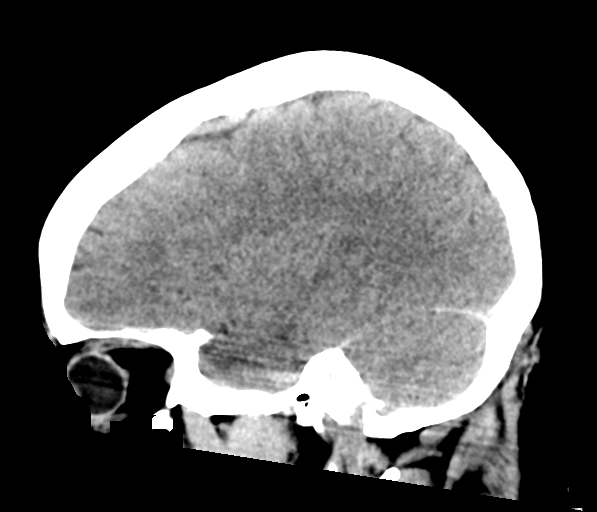
[im 28/55  brain]
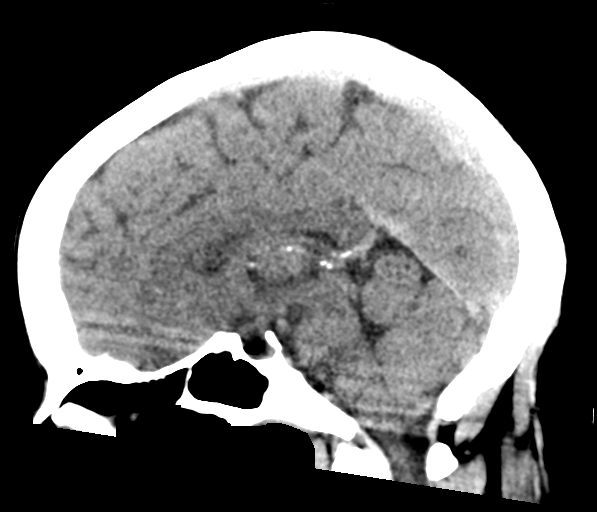
[im 37/55  brain]
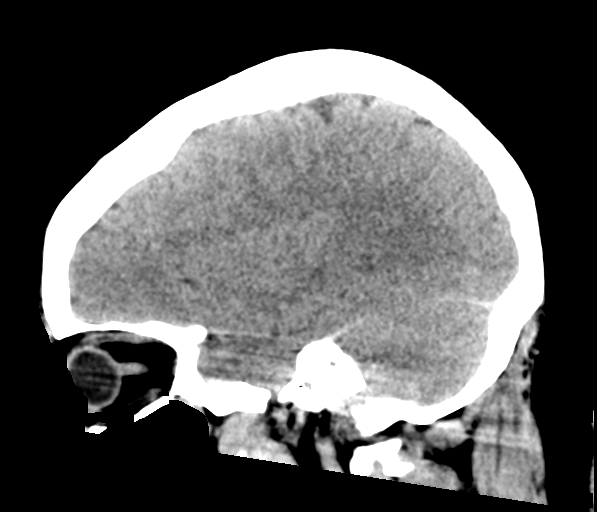

[16 of 47 positions shown; findings below may reference images not displayed]

FINDINGS: Brain: There is no acute intracranial hemorrhage, mass effect, or
edema. Gray-white differentiation is preserved. There is no
extra-axial fluid collection. Ventricles and sulci are within normal
limits in size and configuration.

Vascular: No hyperdense vessel or unexpected calcification.

Skull: Calvarium is unremarkable.

Sinuses/Orbits: No acute finding.

Other: None.
IMPRESSION: No acute intracranial abnormality.

## 2023-12-27 DIAGNOSIS — R52 Pain, unspecified: Secondary | ICD-10-CM | POA: Diagnosis not present

## 2023-12-27 DIAGNOSIS — M5410 Radiculopathy, site unspecified: Secondary | ICD-10-CM | POA: Diagnosis not present

## 2023-12-27 DIAGNOSIS — R202 Paresthesia of skin: Secondary | ICD-10-CM | POA: Diagnosis not present

## 2023-12-29 DIAGNOSIS — G5603 Carpal tunnel syndrome, bilateral upper limbs: Secondary | ICD-10-CM | POA: Diagnosis not present

## 2024-01-16 DIAGNOSIS — I10 Essential (primary) hypertension: Secondary | ICD-10-CM | POA: Diagnosis not present

## 2024-01-16 DIAGNOSIS — E1169 Type 2 diabetes mellitus with other specified complication: Secondary | ICD-10-CM | POA: Diagnosis not present

## 2024-01-20 DIAGNOSIS — D72829 Elevated white blood cell count, unspecified: Secondary | ICD-10-CM | POA: Diagnosis not present

## 2024-01-20 DIAGNOSIS — E1169 Type 2 diabetes mellitus with other specified complication: Secondary | ICD-10-CM | POA: Diagnosis not present

## 2024-01-20 DIAGNOSIS — E1165 Type 2 diabetes mellitus with hyperglycemia: Secondary | ICD-10-CM | POA: Diagnosis not present

## 2024-01-20 DIAGNOSIS — E782 Mixed hyperlipidemia: Secondary | ICD-10-CM | POA: Diagnosis not present

## 2024-01-20 DIAGNOSIS — I10 Essential (primary) hypertension: Secondary | ICD-10-CM | POA: Diagnosis not present

## 2024-01-30 DIAGNOSIS — M778 Other enthesopathies, not elsewhere classified: Secondary | ICD-10-CM | POA: Diagnosis not present

## 2024-01-30 DIAGNOSIS — M79601 Pain in right arm: Secondary | ICD-10-CM | POA: Diagnosis not present

## 2024-01-30 DIAGNOSIS — M25612 Stiffness of left shoulder, not elsewhere classified: Secondary | ICD-10-CM | POA: Diagnosis not present

## 2024-01-30 DIAGNOSIS — M79602 Pain in left arm: Secondary | ICD-10-CM | POA: Diagnosis not present

## 2024-02-01 ENCOUNTER — Telehealth: Payer: Self-pay

## 2024-02-01 NOTE — Telephone Encounter (Signed)
 Who is your primary care physician: Dr.Zach Shona  Reasons for the colonoscopy: screening  Have you had a colonoscopy before?  no  Do you have family history of colon cancer? no  Previous colonoscopy with polyps removed? no  Do you have a history colorectal cancer?   no  Are you diabetic? If yes, Type 1 or Type 2?    no  Do you have a prosthetic or mechanical heart valve? no  Do you have a pacemaker/defibrillator?   no  Have you had endocarditis/atrial fibrillation? no  Have you had joint replacement within the last 12 months?  no  Do you tend to be constipated or have to use laxatives? no  Do you have any history of drugs or alchohol?  no  Do you use supplemental oxygen?  no  Have you had a stroke or heart attack within the last 6 months? no  Do you take weight loss medication?  no  For female patients: have you had a hysterectomy?  yes                                     are you post menopausal?       yes                                            do you still have your menstrual cycle? no      Do you take any blood-thinning medications such as: (aspirin, warfarin, Plavix, Aggrenox)  no  If yes we need the name, milligram, dosage and who is prescribing doctor  Current Outpatient Medications on File Prior to Visit  Medication Sig Dispense Refill   atorvastatin  (LIPITOR) 10 MG tablet Take 1 tablet (10 mg total) by mouth daily. 30 tablet 0   hydrochlorothiazide  (HYDRODIURIL ) 12.5 MG tablet Take 12.5 mg by mouth daily.     No current facility-administered medications on file prior to visit.    No Known Allergies   Ph[facility]acy: [facility]  Primary Insurance Name: CHARON [id number]  Best number where you can be reached: [phone]

## 2024-02-05 DIAGNOSIS — F458 Other somatoform disorders: Secondary | ICD-10-CM | POA: Diagnosis not present

## 2024-02-08 DIAGNOSIS — G5602 Carpal tunnel syndrome, left upper limb: Secondary | ICD-10-CM | POA: Diagnosis not present

## 2024-02-23 DIAGNOSIS — G5603 Carpal tunnel syndrome, bilateral upper limbs: Secondary | ICD-10-CM | POA: Diagnosis not present

## 2024-03-09 NOTE — Telephone Encounter (Signed)
 OK to schedule.  ASA 3 due to BMI

## 2024-03-09 NOTE — Telephone Encounter (Signed)
 LMOVM to call back

## 2024-03-14 NOTE — Telephone Encounter (Signed)
 ATC pt, no answer. Left vm for call back to schedule procedure.

## 2024-03-20 ENCOUNTER — Telehealth (INDEPENDENT_AMBULATORY_CARE_PROVIDER_SITE_OTHER): Payer: Self-pay | Admitting: Gastroenterology

## 2024-03-20 MED ORDER — PEG 3350-KCL-NA BICARB-NACL 420 G PO SOLR: 4000.0000 mL | Freq: Once | ORAL | 0 refills | Status: AC

## 2024-03-20 NOTE — Telephone Encounter (Signed)
 Spoke with patient, scheduled TCS for 03/26/2024 at 9:30am. Rx sent to pharmacy, instructions printed and ready to pt pickup.

## 2024-03-20 NOTE — Addendum Note (Signed)
 Addended by: DALLIE LIONEL RAMAN on: 03/20/2024 01:38 PM   Modules accepted: Orders

## 2024-03-20 NOTE — Telephone Encounter (Signed)
 Pt returning call to schedule procedure. [phone]

## 2024-03-21 NOTE — Telephone Encounter (Signed)
 Pt left vm for call back. Called patient, lvm with pre-op appt details.

## 2024-03-21 NOTE — Patient Instructions (Signed)
   Your procedure is scheduled on: 03/26/2024  Report to [facility] Main Entrance at  7:30   AM.  Call this number if you have problems the morning of surgery: [phone]   Remember:              Follow Directions on the letter you received from Your Physician's office regarding the Bowel Prep              No Smoking the day of Procedure :   Take these medicines the morning of surgery with A SIP OF WATER: none   Do not wear jewelry, make-up or nail polish.    Do not bring valuables to the hospital.  Contacts, dentures or bridgework may not be worn into surgery.  .   Patients discharged the day of surgery will not be allowed to drive home.     Colonoscopy, Adult, Care After This sheet gives you information about how to care for yourself after your procedure. Your health care provider may also give you more specific instructions. If you have problems or questions, contact your health care provider. What can I expect after the procedure? After the procedure, it is common to have: A small amount of blood in your stool for 24 hours after the procedure. Some gas. Mild abdominal cramping or bloating.  Follow these instructions at home: General instructions  For the first 24 hours after the procedure: Do not drive or use machinery. Do not sign important documents. Do not drink alcohol. Do your regular daily activities at a slower pace than normal. Eat soft, easy-to-digest foods. Rest often. Take over-the-counter or prescription medicines only as told by your health care provider. It is up to you to get the results of your procedure. Ask your health care provider, or the department performing the procedure, when your results will be ready. Relieving cramping and bloating Try walking around when you have cramps or feel bloated. Apply heat to your abdomen as told by your health care provider. Use a heat source that your health care provider recommends, such as a moist heat pack or a  heating pad. Place a towel between your skin and the heat source. Leave the heat on for 20-30 minutes. Remove the heat if your skin turns bright red. This is especially important if you are unable to feel pain, heat, or cold. You may have a greater risk of getting burned. Eating and drinking Drink enough fluid to keep your urine clear or pale yellow. Resume your normal diet as instructed by your health care provider. Avoid heavy or fried foods that are hard to digest. Avoid drinking alcohol for as long as instructed by your health care provider. Contact a health care provider if: You have blood in your stool 2-3 days after the procedure. Get help right away if: You have more than a small spotting of blood in your stool. You pass large blood clots in your stool. Your abdomen is swollen. You have nausea or vomiting. You have a fever. You have increasing abdominal pain that is not relieved with medicine. This information is not intended to replace advice given to you by your health care provider. Make sure you discuss any questions you have with your health care provider. Document Released: 02/10/2004 Document Revised: 03/22/2016 Document Reviewed: 09/09/2015 Elsevier Interactive Patient Education  [employer].

## 2024-03-21 NOTE — Telephone Encounter (Signed)
 ATC pt with pre-op appt detail, no answer. LVM.

## 2024-03-21 NOTE — Telephone Encounter (Signed)
 Pt called in stating she received a text she would owe $1000 something dollars for her procedure. Advised we had her down as a screening but gave pre-service center number to discuss the estimate she was given via text.

## 2024-03-22 ENCOUNTER — Encounter (HOSPITAL_COMMUNITY): Payer: Self-pay

## 2024-03-22 ENCOUNTER — Encounter (HOSPITAL_COMMUNITY)
Admission: RE | Admit: 2024-03-22 | Discharge: 2024-03-22 | Disposition: A | Discharge status: Home / Self Care | Source: Ambulatory Visit | Attending: Internal Medicine | Admitting: Internal Medicine

## 2024-03-22 VITALS — Ht 63.0 in | Wt 240.0 lb

## 2024-03-22 DIAGNOSIS — E66813 Obesity, class 3: Secondary | ICD-10-CM | POA: Insufficient documentation

## 2024-03-22 DIAGNOSIS — I10 Essential (primary) hypertension: Secondary | ICD-10-CM | POA: Diagnosis not present

## 2024-03-22 DIAGNOSIS — Z6841 Body Mass Index (BMI) 40.0 and over, adult: Secondary | ICD-10-CM | POA: Insufficient documentation

## 2024-03-22 DIAGNOSIS — Z01818 Encounter for other preprocedural examination: Secondary | ICD-10-CM | POA: Diagnosis not present

## 2024-03-22 DIAGNOSIS — R7303 Prediabetes: Secondary | ICD-10-CM | POA: Insufficient documentation

## 2024-03-22 HISTORY — DX: Nausea with vomiting, unspecified: R11.2

## 2024-03-22 LAB — BASIC METABOLIC PANEL WITH GFR
Anion gap: 9 (ref 5–15)
BUN: 8 mg/dL (ref 6–20)
CO2: 25 mmol/L (ref 22–32)
Calcium: 8.7 mg/dL — ABNORMAL LOW (ref 8.9–10.3)
Chloride: 105 mmol/L (ref 98–111)
Creatinine, Ser: 0.85 mg/dL (ref 0.44–1.00)
GFR, Estimated: >60 mL/min (ref >60)
Glucose, Bld: 128 mg/dL — ABNORMAL HIGH (ref 70–99)
Potassium: 3.4 mmol/L — ABNORMAL LOW (ref 3.5–5.1)
Sodium: 139 mmol/L (ref 135–145)

## 2024-03-26 ENCOUNTER — Other Ambulatory Visit: Payer: Self-pay

## 2024-03-26 ENCOUNTER — Ambulatory Visit (HOSPITAL_COMMUNITY)
Admission: RE | Admit: 2024-03-26 | Discharge: 2024-03-26 | Disposition: A | Discharge status: Home / Self Care | Attending: Internal Medicine | Admitting: Internal Medicine

## 2024-03-26 ENCOUNTER — Ambulatory Visit (HOSPITAL_COMMUNITY): Payer: Self-pay | Admitting: Anesthesiology

## 2024-03-26 ENCOUNTER — Encounter (HOSPITAL_COMMUNITY)
Admission: RE | Disposition: A | Discharge status: Home / Self Care | Payer: Self-pay | Source: Home / Self Care | Attending: Internal Medicine

## 2024-03-26 ENCOUNTER — Encounter (HOSPITAL_COMMUNITY): Payer: Self-pay | Admitting: Internal Medicine

## 2024-03-26 DIAGNOSIS — Z6841 Body Mass Index (BMI) 40.0 and over, adult: Secondary | ICD-10-CM | POA: Diagnosis not present

## 2024-03-26 DIAGNOSIS — R7303 Prediabetes: Secondary | ICD-10-CM

## 2024-03-26 DIAGNOSIS — G709 Myoneural disorder, unspecified: Secondary | ICD-10-CM | POA: Diagnosis not present

## 2024-03-26 DIAGNOSIS — K648 Other hemorrhoids: Secondary | ICD-10-CM | POA: Diagnosis not present

## 2024-03-26 DIAGNOSIS — D123 Benign neoplasm of transverse colon: Secondary | ICD-10-CM | POA: Diagnosis not present

## 2024-03-26 DIAGNOSIS — Z1211 Encounter for screening for malignant neoplasm of colon: Secondary | ICD-10-CM | POA: Insufficient documentation

## 2024-03-26 DIAGNOSIS — I10 Essential (primary) hypertension: Secondary | ICD-10-CM | POA: Insufficient documentation

## 2024-03-26 DIAGNOSIS — K635 Polyp of colon: Secondary | ICD-10-CM | POA: Diagnosis not present

## 2024-03-26 DIAGNOSIS — Z8249 Family history of ischemic heart disease and other diseases of the circulatory system: Secondary | ICD-10-CM | POA: Insufficient documentation

## 2024-03-26 DIAGNOSIS — E6689 Other obesity not elsewhere classified: Secondary | ICD-10-CM | POA: Insufficient documentation

## 2024-03-26 DIAGNOSIS — E66813 Obesity, class 3: Secondary | ICD-10-CM

## 2024-03-26 HISTORY — PX: COLONOSCOPY: SHX5424

## 2024-03-26 SURGERY — COLONOSCOPY
Anesthesia: General

## 2024-03-26 MED ORDER — LACTATED RINGERS IV SOLN: INTRAVENOUS | Status: DC

## 2024-03-26 MED ORDER — STERILE WATER FOR IRRIGATION IR SOLN
Status: DC | PRN
  Administered 2024-03-26: 60 mL

## 2024-03-26 MED ORDER — PROPOFOL 500 MG/50ML IV EMUL
INTRAVENOUS | Status: DC | PRN
  Administered 2024-03-26: 130 mg via INTRAVENOUS
  Administered 2024-03-26: 70 mg via INTRAVENOUS
  Administered 2024-03-26: 200 ug/kg/min via INTRAVENOUS

## 2024-03-26 NOTE — Op Note (Signed)
 [facility] Patient Name: [name] Procedure Date: 03/26/2024 10:17 AM MRN: [id number] Date of Birth: 09-22-1970 Attending MD: Carlin POUR. Cindie , [place], [id number] CSN: [id number] Age: 53 Admit Type: Outpatient Procedure:                Colonoscopy Indications:              Screening for colorectal malignant neoplasm Providers:                Carlin POUR. Cindie, DO, Emilee Tubb RN, RN, [place]                            Wilson, Technician Referring MD:              Medicines:                See the Anesthesia note for documentation of the                            administered medications Complications:            No immediate complications. Estimated Blood Loss:     Estimated blood loss was minimal. Procedure:                Pre-Anesthesia Assessment:                           - The anesthesia plan was to use monitored                            anesthesia care (MAC).                           After obtaining informed consent, the colonoscope                            was passed under direct vision. Throughout the                            procedure, the patient's blood pressure, pulse, and                            oxygen saturations were monitored continuously. The                            [id number]-[id number] ([id number]) Peds Colon was introduced                            through the anus and advanced to the the cecum,                            identified by appendiceal orifice and ileocecal                            valve. The colonoscopy was performed without                            difficulty. The patient tolerated the  procedure                            well. The quality of the bowel preparation was                            evaluated using the BBPS [facility] Bowel Preparation                            Scale) with scores of: Right Colon = 3, Transverse                            Colon = 3 and Left Colon = 3 (entire mucosa seen                            well with no  residual staining, small fragments of                            stool or opaque liquid). The total BBPS score                            equals 9. Scope In: 10:44:10 AM Scope Out: 10:55:44 AM Total Procedure Duration: 0 hours 11 minutes 34 seconds  Findings:      Non-bleeding internal hemorrhoids were found.      A 6 mm polyp was found in the transverse colon. The polyp was sessile.       The polyp was removed with a cold snare. Resection and retrieval were       complete.      The exam was otherwise without abnormality. Impression:               - Non-bleeding internal hemorrhoids.                           - One 6 mm polyp in the transverse colon, removed                            with a cold snare. Resected and retrieved.                           - The examination was otherwise normal. Moderate Sedation:      Per Anesthesia Care Recommendation:           - Patient has a contact number available for                            emergencies. The signs and symptoms of potential                            delayed complications were discussed with the                            patient. Return to normal activities tomorrow.  Written discharge instructions were provided to the                            patient.                           - Resume previous diet.                           - Continue present medications.                           - Await pathology results.                           - Repeat colonoscopy in 7 years for surveillance.                           - Return to GI clinic PRN. Procedure Code(s):        --- Professional ---                           [id number], Colonoscopy, flexible; with removal of                            tumor(s), polyp(s), or other lesion(s) by snare                            technique Diagnosis Code(s):        --- Professional ---                           Z12.11, Encounter for screening for malignant                             neoplasm of colon                           D12.3, Benign neoplasm of transverse colon (hepatic                            flexure or splenic flexure)                           K64.8, Other hemorrhoids CPT copyright 2022 American Medical Association. All rights reserved. The codes documented in this report are preliminary and upon coder review may  be revised to meet current compliance requirements. Carlin POUR. Cindie, DO Carlin POUR. Cindie, DO 03/26/2024 10:59:03 AM This report has been signed electronically. Number of Addenda: 0

## 2024-03-26 NOTE — Anesthesia Procedure Notes (Signed)
 Date/Time: 03/26/2024 10:35 AM  Performed by: Barbarann Verneita RAMAN, CRNAPre-anesthesia Checklist: Patient identified, Emergency Drugs available, Suction available, Timeout performed and Patient being monitored Patient Re-evaluated:Patient Re-evaluated prior to induction Oxygen Delivery Method: Nasal Cannula

## 2024-03-26 NOTE — Anesthesia Preprocedure Evaluation (Signed)
 Anesthesia Evaluation  Patient identified by MRN, date of birth, ID band Patient awake    Reviewed: Allergy & Precautions, H&P , NPO status , Patient's Chart, lab work & pertinent test results, reviewed documented beta blocker date and time   History of Anesthesia Complications (+) PONV and history of anesthetic complications  Airway Mallampati: II  TM Distance: >3 FB Neck ROM: full    Dental no notable dental hx.    Pulmonary neg pulmonary ROS   Pulmonary exam normal breath sounds clear to auscultation       Cardiovascular Exercise Tolerance: Good hypertension, + Valvular Problems/Murmurs  Rhythm:regular Rate:Normal     Neuro/Psych  Headaches  Neuromuscular disease  negative psych ROS   GI/Hepatic negative GI ROS, Neg liver ROS,,,  Endo/Other    Class 4 obesity  Renal/GU negative Renal ROS  negative genitourinary   Musculoskeletal   Abdominal   Peds  Hematology negative hematology ROS (+)   Anesthesia Other Findings   Reproductive/Obstetrics negative OB ROS                              Anesthesia Physical Anesthesia Plan  ASA: 3  Anesthesia Plan: General   Post-op Pain Management:    Induction:   PONV Risk Score and Plan: Propofol  infusion  Airway Management Planned:   Additional Equipment:   Intra-op Plan:   Post-operative Plan:   Informed Consent: I have reviewed the patients History and Physical, chart, labs and discussed the procedure including the risks, benefits and alternatives for the proposed anesthesia with the patient or authorized representative who has indicated his/her understanding and acceptance.     Dental Advisory Given  Plan Discussed with: CRNA  Anesthesia Plan Comments:         Anesthesia Quick Evaluation

## 2024-03-26 NOTE — Transfer of Care (Signed)
 Immediate Anesthesia Transfer of Care Note  Patient: [name] [facility]  Procedure(s) Performed: COLONOSCOPY  Patient Location: Short Stay  Anesthesia Type:General  Level of Consciousness: awake and patient cooperative  Airway & Oxygen Therapy: Patient Spontanous Breathing  Post-op Assessment: Report given to RN and Post -op Vital signs reviewed and stable  Post vital signs: Reviewed and stable  Last Vitals:  Vitals Value Taken Time  BP 127/79 03/26/24 11:03  Temp 36.8 C 03/26/24 11:03  Pulse 97 03/26/24 11:03  Resp 23 03/26/24 11:03  SpO2 98 % 03/26/24 11:03    Last Pain:  Vitals:   03/26/24 1103  TempSrc: Oral  PainSc: 0-No pain      Patients Stated Pain Goal: 0 (03/26/24 1103)  Complications: No notable events documented.

## 2024-03-26 NOTE — Discharge Instructions (Addendum)
  Colonoscopy Discharge Instructions  Read the instructions outlined below and refer to this sheet in the next few weeks. These discharge instructions provide you with general information on caring for yourself after you leave the hospital. Your doctor may also give you specific instructions. While your treatment has been planned according to the most current medical practices available, unavoidable complications occasionally occur.   ACTIVITY You may resume your regular activity, but move at a slower pace for the next 24 hours.  Take frequent rest periods for the next 24 hours.  Walking will help get rid of the air and reduce the bloated feeling in your belly (abdomen).  No driving for 24 hours (because of the medicine (anesthesia) used during the test).   Do not sign any important legal documents or operate any machinery for 24 hours (because of the anesthesia used during the test).  NUTRITION Drink plenty of fluids.  You may resume your normal diet as instructed by your doctor.  Begin with a light meal and progress to your normal diet. Heavy or fried foods are harder to digest and may make you feel sick to your stomach (nauseated).  Avoid alcoholic beverages for 24 hours or as instructed.  MEDICATIONS You may resume your normal medications unless your doctor tells you otherwise.  WHAT YOU CAN EXPECT TODAY Some feelings of bloating in the abdomen.  Passage of more gas than usual.  Spotting of blood in your stool or on the toilet paper.  IF YOU HAD POLYPS REMOVED DURING THE COLONOSCOPY: No aspirin products for 7 days or as instructed.  No alcohol for 7 days or as instructed.  Eat a soft diet for the next 24 hours.  FINDING OUT THE RESULTS OF YOUR TEST Not all test results are available during your visit. If your test results are not back during the visit, make an appointment with your caregiver to find out the results. Do not assume everything is normal if you have not heard from your  caregiver or the medical facility. It is important for you to follow up on all of your test results.  SEEK IMMEDIATE MEDICAL ATTENTION IF: You have more than a spotting of blood in your stool.  Your belly is swollen (abdominal distention).  You are nauseated or vomiting.  You have a temperature over 101.  You have abdominal pain or discomfort that is severe or gets worse throughout the day.   Your colonoscopy revealed 1 polyp(s) which I removed successfully. Await pathology results, my office will contact you. I recommend repeating colonoscopy in 7 years for surveillance purposes.   Otherwise follow up with GI as needed.   I hope you have a great rest of your week!  Charles K. Carver, D.O. Gastroenterology and Hepatology [facility]  

## 2024-03-26 NOTE — H&P (Signed)
 Primary Care Physician:  Shona Norleen PEDLAR, MD Primary Gastroenterologist:  Dr. Cindie  Pre-Procedure History & Physical: HPI:  [name] is a 53 y.o. female is here for a colonoscopy for colon cancer screening purposes.  Patient denies any family history of colorectal cancer.    Past Medical History:  Diagnosis Date   Hypertension    PONV (postoperative nausea and vomiting)    Vertigo     Past Surgical History:  Procedure Laterality Date   ABDOMINAL HYSTERECTOMY     CARPAL TUNNEL RELEASE Left    CESAREAN SECTION     CHOLECYSTECTOMY N/A 05/26/2018   Procedure: LAPAROSCOPIC CHOLECYSTECTOMY;  Surgeon: Kallie Manuelita BROCKS, MD;  Location: [facility];  Service: General;  Laterality: N/A;    Prior to Admission medications   Medication Sig Start Date End Date Taking? Authorizing Provider  atorvastatin  (LIPITOR) 10 MG tablet Take 1 tablet (10 mg total) by mouth daily. 11/12/21  Yes Bryn Bernardino NOVAK, MD  hydrochlorothiazide  (HYDRODIURIL ) 12.5 MG tablet Take 12.5 mg by mouth daily.   Yes [provider]    Allergies as of 03/20/2024   (No Known Allergies)    Family History  Problem Relation Age of Onset   Hypertension Mother    Diabetes Father    Hypertension Father     Social History   Socioeconomic History   Marital status: Married    Spouse name: Not on file   Number of children: Not on file   Years of education: Not on file   Highest education level: Not on file  Occupational History   Not on file  Tobacco Use   Smoking status: Never   Smokeless tobacco: Never  Vaping Use   Vaping status: Never Used  Substance and Sexual Activity   Alcohol use: No   Drug use: No   Sexual activity: Not on file  Other Topics Concern   Not on file  Social History Narrative   Married for 11 years.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   Social Drivers of Corporate investment banker Strain: Not on file  Food Insecurity: Not on file  Transportation Needs: Not  on file  Physical Activity: Not on file  Stress: Not on file  Social Connections: Unknown (11/24/2021)   Received from [facility]   Social Network    Social Network: Not on file  Intimate Partner Violence: Unknown (10/16/2021)   Received from [facility]   HITS    Physically Hurt: Not on file    Insult or Talk Down To: Not on file    Threaten Physical Harm: Not on file    Scream or Curse: Not on file    Review of Systems: See HPI, otherwise negative ROS  Physical Exam: Vital signs in last 24 hours: Temp:  [98.3 F (36.8 C)] 98.3 F (36.8 C) (09/15 0808) Pulse Rate:  [73] 73 (09/15 0808) Resp:  [17] 17 (09/15 0808) BP: (160)/(86) 160/86 (09/15 0808) SpO2:  [100 %] 100 % (09/15 0808) Weight:  [108.9 kg] 108.9 kg (09/15 0808)   General:   Alert,  Well-developed, well-nourished, pleasant and cooperative in NAD Head:  Normocephalic and atraumatic. Eyes:  Sclera clear, no icterus.   Conjunctiva pink. Ears:  Normal auditory acuity. Nose:  No deformity, discharge,  or lesions. Msk:  Symmetrical without gross deformities. Normal posture. Extremities:  Without clubbing or edema. Neurologic:  Alert and  oriented x4;  grossly normal neurologically. Skin:  Intact without significant lesions or rashes.  Psych:  Alert and cooperative. Normal mood and affect.  Impression/Plan: [name] is here for a colonoscopy to be performed for colon cancer screening purposes.  The risks of the procedure including infection, bleed, or perforation as well as benefits, limitations, alternatives and imponderables have been reviewed with the patient. Questions have been answered. All parties agreeable.

## 2024-03-27 LAB — SURGICAL PATHOLOGY

## 2024-03-27 NOTE — Anesthesia Postprocedure Evaluation (Signed)
 Anesthesia Post Note  Patient: [name] [facility]  Procedure(s) Performed: COLONOSCOPY  Patient location during evaluation: Phase II Anesthesia Type: General Level of consciousness: awake Pain management: pain level controlled Vital Signs Assessment: post-procedure vital signs reviewed and stable Respiratory status: spontaneous breathing and respiratory function stable Cardiovascular status: blood pressure returned to baseline and stable Postop Assessment: no headache and no apparent nausea or vomiting Anesthetic complications: no Comments: Late entry   No notable events documented.   Last Vitals:  Vitals:   03/26/24 0808 03/26/24 1103  BP: (!) 160/86 127/79  Pulse: 73 97  Resp: 17 (!) 23  Temp: 36.8 C 36.8 C  SpO2: 100% 98%    Last Pain:  Vitals:   03/26/24 1103  TempSrc: Oral  PainSc: 0-No pain                 [name]

## 2024-03-29 ENCOUNTER — Encounter (HOSPITAL_COMMUNITY): Payer: Self-pay | Admitting: Internal Medicine

## 2024-03-29 DIAGNOSIS — G5603 Carpal tunnel syndrome, bilateral upper limbs: Secondary | ICD-10-CM | POA: Diagnosis not present

## 2024-04-03 ENCOUNTER — Ambulatory Visit: Payer: Self-pay | Admitting: Internal Medicine

## 2024-04-04 DIAGNOSIS — M79642 Pain in left hand: Secondary | ICD-10-CM | POA: Diagnosis not present

## 2024-04-04 DIAGNOSIS — R29898 Other symptoms and signs involving the musculoskeletal system: Secondary | ICD-10-CM | POA: Diagnosis not present

## 2024-04-04 DIAGNOSIS — M25632 Stiffness of left wrist, not elsewhere classified: Secondary | ICD-10-CM | POA: Diagnosis not present

## 2024-04-11 DIAGNOSIS — M25632 Stiffness of left wrist, not elsewhere classified: Secondary | ICD-10-CM | POA: Diagnosis not present

## 2024-04-11 DIAGNOSIS — R29898 Other symptoms and signs involving the musculoskeletal system: Secondary | ICD-10-CM | POA: Diagnosis not present

## 2024-04-11 DIAGNOSIS — M79642 Pain in left hand: Secondary | ICD-10-CM | POA: Diagnosis not present

## 2024-04-19 DIAGNOSIS — M25632 Stiffness of left wrist, not elsewhere classified: Secondary | ICD-10-CM | POA: Diagnosis not present

## 2024-04-19 DIAGNOSIS — R29898 Other symptoms and signs involving the musculoskeletal system: Secondary | ICD-10-CM | POA: Diagnosis not present

## 2024-04-19 DIAGNOSIS — M79642 Pain in left hand: Secondary | ICD-10-CM | POA: Diagnosis not present

## 2024-04-30 DIAGNOSIS — E1169 Type 2 diabetes mellitus with other specified complication: Secondary | ICD-10-CM | POA: Diagnosis not present

## 2024-05-04 ENCOUNTER — Encounter: Payer: Self-pay | Admitting: *Deleted

## 2024-05-04 DIAGNOSIS — E1169 Type 2 diabetes mellitus with other specified complication: Secondary | ICD-10-CM | POA: Diagnosis not present

## 2024-05-04 DIAGNOSIS — E1165 Type 2 diabetes mellitus with hyperglycemia: Secondary | ICD-10-CM | POA: Diagnosis not present

## 2024-05-04 DIAGNOSIS — E782 Mixed hyperlipidemia: Secondary | ICD-10-CM | POA: Diagnosis not present

## 2024-05-04 DIAGNOSIS — I10 Essential (primary) hypertension: Secondary | ICD-10-CM | POA: Diagnosis not present

## 2024-05-04 NOTE — Progress Notes (Signed)
 [name]                                          MRN: [id number]   05/04/2024   The VBCI Quality Team Specialist reviewed this patient medical record for the purposes of chart review for care gap closure. The following were reviewed: abstraction for care gap closure-controlling blood pressure.    VBCI Quality Team

## 2024-05-09 DIAGNOSIS — I10 Essential (primary) hypertension: Secondary | ICD-10-CM | POA: Diagnosis not present

## 2024-05-09 DIAGNOSIS — G5601 Carpal tunnel syndrome, right upper limb: Secondary | ICD-10-CM | POA: Diagnosis not present

## 2024-05-24 DIAGNOSIS — G5603 Carpal tunnel syndrome, bilateral upper limbs: Secondary | ICD-10-CM | POA: Diagnosis not present

## 2024-06-04 ENCOUNTER — Other Ambulatory Visit: Payer: Self-pay | Admitting: Medical Oncology

## 2024-06-04 ENCOUNTER — Inpatient Hospital Stay: Attending: Medical Oncology | Admitting: Medical Oncology

## 2024-06-04 ENCOUNTER — Inpatient Hospital Stay

## 2024-06-04 ENCOUNTER — Encounter: Payer: Self-pay | Admitting: Medical Oncology

## 2024-06-04 DIAGNOSIS — R700 Elevated erythrocyte sedimentation rate: Secondary | ICD-10-CM | POA: Insufficient documentation

## 2024-06-04 DIAGNOSIS — R7982 Elevated C-reactive protein (CRP): Secondary | ICD-10-CM | POA: Insufficient documentation

## 2024-06-04 DIAGNOSIS — D72829 Elevated white blood cell count, unspecified: Secondary | ICD-10-CM | POA: Insufficient documentation

## 2024-06-04 DIAGNOSIS — D72820 Lymphocytosis (symptomatic): Secondary | ICD-10-CM

## 2024-06-04 DIAGNOSIS — R1012 Left upper quadrant pain: Secondary | ICD-10-CM

## 2024-06-04 DIAGNOSIS — R519 Headache, unspecified: Secondary | ICD-10-CM

## 2024-06-04 DIAGNOSIS — R5383 Other fatigue: Secondary | ICD-10-CM

## 2024-06-04 LAB — CBC WITH DIFFERENTIAL/PLATELET
Abs Immature Granulocytes: 0.02 K/uL (ref 0.00–0.07)
Basophils Absolute: 0 K/uL (ref 0.0–0.1)
Basophils Relative: 1 %
Eosinophils Absolute: 0.4 K/uL (ref 0.0–0.5)
Eosinophils Relative: 5 %
HCT: 38.6 % (ref 36.0–46.0)
Hemoglobin: 12.5 g/dL (ref 12.0–15.0)
Immature Granulocytes: 0 %
Lymphocytes Relative: 41 %
Lymphs Abs: 3.3 K/uL (ref 0.7–4.0)
MCH: 26.8 pg (ref 26.0–34.0)
MCHC: 32.4 g/dL (ref 30.0–36.0)
MCV: 82.8 fL (ref 80.0–100.0)
Monocytes Absolute: 0.4 K/uL (ref 0.1–1.0)
Monocytes Relative: 5 %
Neutro Abs: 3.9 K/uL (ref 1.7–7.7)
Neutrophils Relative %: 48 %
Platelets: 321 K/uL (ref 150–400)
RBC: 4.66 MIL/uL (ref 3.87–5.11)
RDW: 14.6 % (ref 11.5–15.5)
WBC: 8.2 K/uL (ref 4.0–10.5)
nRBC: 0 % (ref 0.0–0.2)

## 2024-06-04 LAB — COMPREHENSIVE METABOLIC PANEL WITH GFR
ALT: 18 U/L (ref 0–44)
AST: 22 U/L (ref 15–41)
Albumin: 4 g/dL (ref 3.5–5.0)
Alkaline Phosphatase: 122 U/L (ref 38–126)
Anion gap: 11 (ref 5–15)
BUN: 8 mg/dL (ref 6–20)
CO2: 26 mmol/L (ref 22–32)
Calcium: 9.2 mg/dL (ref 8.9–10.3)
Chloride: 104 mmol/L (ref 98–111)
Creatinine, Ser: 0.79 mg/dL (ref 0.44–1.00)
GFR, Estimated: >60 mL/min (ref >60)
Glucose, Bld: 126 mg/dL — ABNORMAL HIGH (ref 70–99)
Potassium: 3.7 mmol/L (ref 3.5–5.1)
Sodium: 141 mmol/L (ref 135–145)
Total Bilirubin: 0.5 mg/dL (ref 0.0–1.2)
Total Protein: 7.3 g/dL (ref 6.5–8.1)

## 2024-06-04 LAB — LACTATE DEHYDROGENASE: LDH: 147 U/L (ref 105–235)

## 2024-06-04 LAB — HEPATITIS C ANTIBODY: HCV Ab: NONREACTIVE

## 2024-06-04 LAB — TECHNOLOGIST SMEAR REVIEW
Clinical Information: ELEVATED
Plt Morphology: NORMAL
WBC MORPHOLOGY: REACTIVE

## 2024-06-04 LAB — HEPATITIS B SURFACE ANTIBODY,QUALITATIVE: Hep B S Ab: NONREACTIVE

## 2024-06-04 LAB — SEDIMENTATION RATE: Sed Rate: 43 mm/h — ABNORMAL HIGH (ref 0–30)

## 2024-06-04 LAB — C-REACTIVE PROTEIN: CRP: 1.6 mg/dL — ABNORMAL HIGH (ref <1.0)

## 2024-06-04 LAB — HIV ANTIBODY (ROUTINE TESTING W REFLEX): HIV Screen 4th Generation wRfx: NONREACTIVE

## 2024-06-04 LAB — HEPATITIS B SURFACE ANTIGEN: Hepatitis B Surface Ag: NONREACTIVE

## 2024-06-04 NOTE — Progress Notes (Signed)
 Virtual Visit Progress Note  [name],you are scheduled for a virtual visit with your provider today.    Just as we do with appointments in the office, we must obtain your consent to participate.  Your consent will be active for this visit and any virtual visit you may have with one of our providers in the next 365 days.    If you have a MyChart account, I can also send a copy of this consent to you electronically.  All virtual visits are billed to your insurance company just like a traditional visit in the office.  As this is a virtual visit, video technology does not allow for your provider to perform a traditional examination.  This may limit your provider's ability to fully assess your condition.  If your provider identifies any concerns that need to be evaluated in person or the need to arrange testing such as labs, EKG, etc, we will make arrangements to do so.    Although advances in technology are sophisticated, we cannot ensure that it will always work on either your end or our end.  If the connection with a video visit is poor, we may have to switch to a telephone visit.  With either a video or telephone visit, we are not always able to ensure that we have a secure connection.   I need to obtain your verbal consent now.   Are you willing to proceed with your visit today?   [name] has provided verbal consent on 06/04/2024 for a virtual visit (video or telephone).   Lauraine CHRISTELLA Dais, PA-C 06/04/2024  1:42 PM    I connected with [name] on 06/04/24 at  1:00 PM EST by video enabled telemedicine visit and verified that I am speaking with the correct person using two identifiers. They are new to our practice.   I discussed the limitations, risks, security and privacy concerns of performing an evaluation and management service by telemedicine and the availability of in-person appointments. I also discussed with the patient that there may be a patient responsible  charge related to this service. The patient expressed understanding and agreed to proceed.   Other persons participating in the visit and their role in the encounter:    Patient's location: [place] Provider's location: Clinic   Chief Complaint/Reason for Referral: Elevated WBC count    Patient Care Team: Shona Norleen PEDLAR, MD as PCP - General (Internal Medicine)   Name of the patient: [name]  [id number]  03-Jun-1971   Date of visit: 06/04/24  History of Presenting Illness:  Patient is being referred to us  today for evaluation of an elevated WBC. She is being referred by Dr. Joeann who is her PCP  Her WBC changes appear to be new since her last visit. At this visit it was normal visit with her and not a sick visit.   She did have Carpel tunnel surgery on July 30th 2025. She had her visit with her PCP on 04/27/2024. She had another on Oct 29th. She does not recall being on any steroids around this time.   Signs of infection: No Recent illness: No Sick contacts: No Recent travel: No Fever: No  Night Sweats: No Weight loss: No Obesity: Yes Bleeding/bruising: No Fatigue: Yes Inflammatory conditions: No history of RA, Kawasaki, Crohn's, Ulcerative colitis, chronic hepatitis, Sweet syndrome:  No Malignancies: No Sickle Cell Disease: No Smoking: No Vigorous exercise: No Asplenia or spleen injury: No Pregnancy: No Thyroid  disease: No Family  History Leukocytosis: No Occupational hazards to benzene, pesticides, industrial chemicals: No History of chemotherapy/radiation: No Pruritus: Yes on hands and feet. All the time.  Cough: No Rash: No SOB/Chest pain/Dyspnea/Peripheral edema: (eosinophilic myocarditis):No Steroid Use/ Bone marrow stimulators/ Catecholamines, Lithium, Plerixafor: No Thyroid  Disease:  Extreme Physical or emotional Stress: Yes Family History of Neutrophilia: Unsure No known history of anemia No lumps or bumps that she has noticed.   No known nutritional  deficiency.   She reports some LUQ pain described as an ache of her left upper abdomen which she has had for about 6 months. Resting or aleve helps. No worsening factors. Food does not change symptoms. No injury to the area. No bruising of the area. She has not seen GI about this.   She has not seen a neurologist for her headaches or dizziness. She gets them daily. They start in the morning. No aura. She takes aleve which does help. No head injury. No vomiting when she gets a headache. Did have some blurry vision was given a prescription for glasses. She is unsure if they help with her headaches as she reports not wearing her glasses like she should.   She is UTD with a colonoscopy on 03/26/2024- 7 year recall for one benign polyp.   Review of systems- Review of Systems  Constitutional:  Positive for malaise/fatigue.  HENT: Negative.    Eyes: Negative.   Respiratory: Negative.    Cardiovascular: Negative.   Gastrointestinal:  Positive for abdominal pain (Upper left hand side pain).  Genitourinary: Negative.   Musculoskeletal: Negative.   Skin:  Positive for itching.  Neurological:  Positive for dizziness (chronic from vertigo) and headaches.  Endo/Heme/Allergies: Negative.   Psychiatric/Behavioral:  The patient has insomnia.      No Known Allergies  Past Medical History:  Diagnosis Date   Hypertension    PONV (postoperative nausea and vomiting)    Vertigo     Past Surgical History:  Procedure Laterality Date   ABDOMINAL HYSTERECTOMY     CARPAL TUNNEL RELEASE Left    CESAREAN SECTION     CHOLECYSTECTOMY N/A 05/26/2018   Procedure: LAPAROSCOPIC CHOLECYSTECTOMY;  Surgeon: Kallie Manuelita BROCKS, MD;  Location: [facility];  Service: General;  Laterality: N/A;   COLONOSCOPY N/A 03/26/2024   Procedure: COLONOSCOPY;  Surgeon: Cindie Carlin POUR, DO;  Location: [facility];  Service: Endoscopy;  Laterality: N/A;  9:30am, ASA 3    Social History   Socioeconomic History   Marital status:  Married    Spouse name: Not on file   Number of children: Not on file   Years of education: Not on file   Highest education level: Not on file  Occupational History   Not on file  Tobacco Use   Smoking status: Never   Smokeless tobacco: Never  Vaping Use   Vaping status: Never Used  Substance and Sexual Activity   Alcohol use: No   Drug use: No   Sexual activity: Not on file  Other Topics Concern   Not on file  Social History Narrative   Married for 11 years.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   Social Drivers of Corporate Investment Banker Strain: Not on file  Food Insecurity: No Food Insecurity (06/04/2024)   Hunger Vital Sign    Worried About Running Out of Food in the Last Year: Never true    Ran Out of Food in the Last Year: Never true  Transportation Needs: No Transportation Needs (06/04/2024)  PRAPARE - Administrator, Civil Service (Medical): No    Lack of Transportation (Non-Medical): No  Physical Activity: Not on file  Stress: Not on file  Social Connections: Not on file  Intimate Partner Violence: Not At Risk (06/04/2024)   Humiliation, Afraid, Rape, and Kick questionnaire    Fear of Current or Ex-Partner: No    Emotionally Abused: No    Physically Abused: No    Sexually Abused: No    Immunization History  Administered Date(s) Administered   Influenza, Seasonal, Injecte, Preservative Fre 03/25/2023   Influenza,inj,Quad PF,6+ Mos 04/29/2020   Janssen (J&J) SARS-COV-2 Vaccination 09/30/2019   Moderna Sars-Covid-2 Vaccination 06/25/2020   Tdap 02/04/2021    Family History  Problem Relation Age of Onset   Hypertension Mother    Diabetes Father    Hypertension Father      Current Outpatient Medications:    amLODipine  (NORVASC ) 5 MG tablet, Take 5 mg by mouth daily., Disp: , Rfl:    atorvastatin  (LIPITOR) 20 MG tablet, Take 20 mg by mouth daily., Disp: , Rfl:    doxepin (SINEQUAN) 25 MG capsule, Take 25-75 mg by mouth  at bedtime., Disp: , Rfl:    hydrochlorothiazide  (HYDRODIURIL ) 12.5 MG tablet, Take 12.5 mg by mouth daily., Disp: , Rfl:    ondansetron  (ZOFRAN ) 4 MG tablet, Take 4 mg by mouth every 8 (eight) hours as needed., Disp: , Rfl:   Physical exam: Exam limited due to telemedicine Physical Exam Constitutional:      General: She is not in acute distress.    Appearance: Normal appearance. She is not ill-appearing, toxic-appearing or diaphoretic.  HENT:     Head: Normocephalic.  Eyes:     Extraocular Movements: Extraocular movements intact.  Pulmonary:     Effort: Pulmonary effort is normal.  Musculoskeletal:     Cervical back: Neck supple.  Neurological:     Mental Status: She is alert.        Visit Diagnosis 1. Morning headache   2. Other fatigue   3. LUQ abdominal pain   4. Elevated lymphocyte count     Assessment and plan- Patient is a 53 y.o. female who was referred to us  for an elevated lymphocyte count.    Review of external labs from 05/01/2024 shows a Lymphocyte count of 4 with total WBC of 11.1. Labs from 01/17/2024 show a WBC of 10.3 but lymph count of 3.2 and labs from 08/25/2023 show a WBC of 10.2 with lymph count of 3.5.   We discussed that leukocytosis is an elevation of the WBC of the body. This condition is fairly common and there are many various potential causes. Often times leukocytosis resolves on its own without any intervention.   There are different types of WBCs and elevations in each can help determine its potential cause. For example, eosinophilia can indicate an allergic response or response to parasitic infections/chronic disease. Lymphocytosis can be from viral syndromes or autoimmune causes. Elevated neutrophil count is the most common form of leukocytosis. About 2.5% of the population naturally falls outside of the standard range. Significant elevations, 2 times above the standard deviation often indicate a bacterial infection. Acute and chronic inflammation  due to conditions like rheumatic diseases, Kawasaki disease, adult-onset Still disease, inflammatory bowel disease, and chronic hepatitis are common causes. Additionally, myeloproliferative neoplasms, asplenia, cigarette smoking, stress, pregnancy, obesity, thyroid  disorders, Down syndrome, nonhematologic malignancies, and medications like glucocorticoids, catecholamines, and lithium are potential causes.   A leukemoid reaction is  a transient increase in WBC count marked by a neutrophil count of >50,000 cells/L without a myeloproliferative neoplasm. Medications, asplenia, nonhematologic malignancies, and infection with Costridioides difficile, tuberculosis, pertussis, and visceral larva migrans can cause a leukemoid reaction. This acute inflammatory reaction can be mistaken for leukemia, but careful history, physical examination, and further laboratory evaluation can confirm the diagnosis. Peripheral smears and radiological imaging may be necessary to identify the actual cause of these reactive laboratory findings.[3] This diagnosis must be differentiated from leukemia, defined as increases in blast cells, precursor cells to leukocytes, and immature WBCs rather than mature neutrophils seen in a leukemoid reaction. A leukemoid reaction improves after treating the underlying cause of the neutrophilia, whereas leukemia continues to demonstrate elevated WBCs until the completion of definitive treatment.   Hypersensitivity reactions, leukemia, stress, asplenia, thymoma, and lymphoma may cause lymphocytosis. Infectious causes of lymphocytosis are generally viral, like EBV, cytomegalovirus, influenza, measles, mumps, rubella, adenovirus, and Coxsackie virus. Bacterial infections like pertussis and cat scratch disease cause lymphocytosis. Additional possible infectious causes are tuberculosis, brucellosis, babesiosis, and syphilis.   Eosinophils account for approximately 1% to 4% of a person's leukocytes, and an  eosinophil count >500 cells/L defines eosinophilia. The list of potential underlying causes of eosinophilia is extensive. Allergic conditions like allergic rhinitis and atopic dermatitis are common causes and are generally associated with mild eosinophilia. Patients with severe eosinophilia, >=20,000 cells/L, are more likely to have a myeloid neoplasm. Besides these 2 extremes, the level of eosinophilia does not help distinguish the underlying cause.  Infectious causes of eosinophilia include helminths, fungi, protozoa, some bacteria, HIV, human T-cell lymphotropic virus type 1, and scabies.[6][7] Nearly any medication reaction can cause eosinophilia. However, nonsteroidal anti-inflammatory (NSAID) medications, allopurinol, phenytoin, penicillins, cephalosporins, and sulfasalazine are some of the more commonly known medications associated with specific syndromes involving eosinophilia. Additional causes of eosinophilia are rheumatologic diseases like eosinophilic granulomatosis with polyangiitis, adrenal insufficiency, and immunodeficiency syndromes.  Work up often includes labs to address potential causes and monitoring for abrupt significant changes to lab values. For persistent/significant leukocytosis, a bone marrow biopsy is typically recommended.   Leukoerythroblastosis typically suggests a myelophthisic process, where normal marrow space is infiltrated and replaced by nonhematopoietic or abnormal cells. The peripheral smear reveals leukocytosis with immature erythroid, myeloid, and blast cells in the peripheral blood. Other potential causes are cytokine release, such as severe acute respiratory syndrome coronavirus 2 (SARS-CoV-2), direct myelotoxicity, and viral infection  Transient basophilia is a reactive response, especially to an acute viral illness. Persistent basophilia on serial CBCs for longer than 8 weeks suggests an underlying malignancy or myeloproliferative disease.   I do want to  evaluate her abdominal pain with an abdominal US . I also want her to have a consultation for a sleep study in addition to her blood work today.   I have also suggested that she discuss her abdominal pain and headaches more with her PCP. She may also need referrals to GI and neurology.    Disposition: Labs today  US  Abdomen Referral for sleep study  RTC 3 months APP, labs   Patient expressed understanding and was in agreement with this plan. She also understands that She can call clinic at any time with any questions, concerns, or complaints.   I discussed the assessment and treatment plan with the patient. The patient was provided an opportunity to ask questions and all were answered. The patient agreed with the plan and demonstrated an understanding of the instructions.   The patient was advised  to call back or seek an in-person evaluation if the symptoms worsen or if the condition fails to improve as anticipated.   I spent 40 minutes face-to-face video visit time dedicated to the care of this patient on the date of this encounter to include pre-visit review of recent CBCs and office visit notes from PCP, face-to-face time with the patient, and post visit ordering of testing/documentation.   Thank you for allowing me to participate in the care of this very pleasant patient.   Lauraine Dais PA-C 06/04/24  [facility] [address] [place], [place] [address] Phone: [phone]

## 2024-06-05 LAB — SYPHILIS: RPR W/REFLEX TO RPR TITER AND TREPONEMAL ANTIBODIES, TRADITIONAL SCREENING AND DIAGNOSIS ALGORITHM: RPR Ser Ql: NONREACTIVE

## 2024-06-05 LAB — THYROID PANEL WITH TSH
Free Thyroxine Index: 2.1 (ref 1.2–4.9)
T3 Uptake Ratio: 30 % (ref 24–39)
T4, Total: 6.9 ug/dL (ref 4.5–12.0)
TSH: 1.45 u[IU]/mL (ref 0.450–4.500)

## 2024-06-05 LAB — SURGICAL PATHOLOGY

## 2024-06-05 LAB — HEPATITIS B CORE ANTIBODY, TOTAL: HEP B CORE AB: NEGATIVE

## 2024-06-06 ENCOUNTER — Ambulatory Visit: Payer: Self-pay | Admitting: Medical Oncology

## 2024-06-07 LAB — QUANTIFERON-TB GOLD PLUS (RQFGPL)
QuantiFERON Mitogen Value: 1.99 [IU]/mL
QuantiFERON Nil Value: 0.02 [IU]/mL
QuantiFERON TB1 Ag Value: 0.02 [IU]/mL
QuantiFERON TB2 Ag Value: 0.01 [IU]/mL

## 2024-06-07 LAB — QUANTIFERON-TB GOLD PLUS: QuantiFERON-TB Gold Plus: NEGATIVE

## 2024-06-14 LAB — FLOW CYTOMETRY

## 2024-06-15 ENCOUNTER — Ambulatory Visit (HOSPITAL_COMMUNITY)
Admission: RE | Admit: 2024-06-15 | Discharge: 2024-06-15 | Disposition: A | Source: Ambulatory Visit | Attending: Medical Oncology | Admitting: Medical Oncology

## 2024-06-15 DIAGNOSIS — Z9049 Acquired absence of other specified parts of digestive tract: Secondary | ICD-10-CM | POA: Diagnosis not present

## 2024-06-15 DIAGNOSIS — R1012 Left upper quadrant pain: Secondary | ICD-10-CM

## 2024-06-15 DIAGNOSIS — K7689 Other specified diseases of liver: Secondary | ICD-10-CM | POA: Diagnosis not present

## 2024-06-15 DIAGNOSIS — D72829 Elevated white blood cell count, unspecified: Secondary | ICD-10-CM | POA: Diagnosis not present

## 2024-07-09 ENCOUNTER — Encounter: Payer: Self-pay | Admitting: *Deleted

## 2024-07-24 ENCOUNTER — Encounter: Payer: Self-pay | Admitting: Neurology

## 2024-07-24 ENCOUNTER — Ambulatory Visit: Admitting: Neurology

## 2024-07-24 VITALS — BP 145/86 | HR 71 | Ht 64.0 in | Wt 251.0 lb

## 2024-07-24 DIAGNOSIS — Z820 Family history of epilepsy and other diseases of the nervous system: Secondary | ICD-10-CM | POA: Diagnosis not present

## 2024-07-24 DIAGNOSIS — R0689 Other abnormalities of breathing: Secondary | ICD-10-CM | POA: Diagnosis not present

## 2024-07-24 DIAGNOSIS — R0683 Snoring: Secondary | ICD-10-CM

## 2024-07-24 DIAGNOSIS — Z9189 Other specified personal risk factors, not elsewhere classified: Secondary | ICD-10-CM | POA: Diagnosis not present

## 2024-07-24 DIAGNOSIS — R030 Elevated blood-pressure reading, without diagnosis of hypertension: Secondary | ICD-10-CM

## 2024-07-24 DIAGNOSIS — R519 Headache, unspecified: Secondary | ICD-10-CM | POA: Diagnosis not present

## 2024-07-24 DIAGNOSIS — D72820 Lymphocytosis (symptomatic): Secondary | ICD-10-CM | POA: Diagnosis not present

## 2024-07-24 DIAGNOSIS — R351 Nocturia: Secondary | ICD-10-CM | POA: Diagnosis not present

## 2024-07-24 NOTE — Progress Notes (Signed)
 Subjective:    Patient ID: [name] is a 54 y.o. female.  HPI    True Mar, MD, PhD [facility] [address] [address] [place], [place] [address]  Dear [name],   I saw your patient, [name], upon your kind request in my sleep clinic today for initial consultation of her sleep disorder, in particular, concern for underlying obstructive sleep apnea.  The patient is unaccompanied today.  As you know, [name] is a 54 year old female with an underlying medical history of hypertension, history of vertigo, elevated lymphocyte count, status post carpal tunnel surgery, and severe obesity with a BMI of over 40, who reports snoring and excessive daytime somnolence, nonrestorative sleep and occasional waking up with a gasping sensation and frequent morning headaches.  Her Epworth sleepiness score is 15 out of 24, fatigue severity score is 40 out of 63.  She lives at home with her family including husband and 3 year old son.  She also has a 48 year old daughter.  She works in a audiological scientist.  She has to be at work at 4:30 AM.  Bedtime is generally between 9 and 10 PM and rise time around 3:30 AM.  She takes over-the-counter medication for morning headaches including Advil.  She did take Advil today.  She has no symptoms from her mildly elevated blood pressure today.  She has nocturia about twice per average night.  She has a family history of sleep apnea affecting her dad who is on a PAP machine.  She is working on weight loss and reducing her soda intake.  She limits herself to 1 diet soda per day currently, 16.9 ounce size.  She is a non-smoker and does not drink alcohol.  She has had weight gain.  In the past year she gained about 30 pounds. They have 1 dog in the household.  She does sleep with the TV on in her bedroom.  Her Past Medical History Is Significant For: Past Medical History:  Diagnosis Date   Hypertension    PONV  (postoperative nausea and vomiting)    Vertigo     Her Past Surgical History Is Significant For: Past Surgical History:  Procedure Laterality Date   ABDOMINAL HYSTERECTOMY     CARPAL TUNNEL RELEASE Left    CESAREAN SECTION     CHOLECYSTECTOMY N/A 05/26/2018   Procedure: LAPAROSCOPIC CHOLECYSTECTOMY;  Surgeon: Jasmine Manuelita BROCKS, MD;  Location: [facility];  Service: General;  Laterality: N/A;   COLONOSCOPY N/A 03/26/2024   Procedure: COLONOSCOPY;  Surgeon: Jasmine Carlin POUR, DO;  Location: [facility];  Service: Endoscopy;  Laterality: N/A;  9:30am, ASA 3    Her Family History Is Significant For: Family History  Problem Relation Age of Onset   Hypertension Mother    Diabetes Father    Hypertension Father     Her Social History Is Significant For: Social History   Socioeconomic History   Marital status: Married    Spouse name: Not on file   Number of children: Not on file   Years of education: Not on file   Highest education level: Not on file  Occupational History   Not on file  Tobacco Use   Smoking status: Never   Smokeless tobacco: Never  Vaping Use   Vaping status: Never Used  Substance and Sexual Activity   Alcohol use: No   Drug use: No   Sexual activity: Not on file  Other Topics Concern   Not on  file  Social History Narrative   Married for 11 years.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   Social Drivers of Health   Tobacco Use: Low Risk (07/24/2024)   Patient History    Smoking Tobacco Use: Never    Smokeless Tobacco Use: Never    Passive Exposure: Not on file  Financial Resource Strain: Not on file  Food Insecurity: No Food Insecurity (06/04/2024)   Epic    Worried About Programme Researcher, Broadcasting/film/video in the Last Year: Never true    Ran Out of Food in the Last Year: Never true  Transportation Needs: No Transportation Needs (06/04/2024)   Epic    Lack of Transportation (Medical): No    Lack of Transportation (Non-Medical): No  Physical Activity:  Not on file  Stress: Not on file  Social Connections: Not on file  Depression (PHQ2-9): Low Risk (06/04/2024)   Depression (PHQ2-9)    PHQ-2 Score: 0  Alcohol Screen: Not on file  Housing: Low Risk (06/04/2024)   Epic    Unable to Pay for Housing in the Last Year: No    Number of Times Moved in the Last Year: 0    Homeless in the Last Year: No  Utilities: Not At Risk (06/04/2024)   Epic    Threatened with loss of utilities: No  Health Literacy: Not on file    Her Allergies Are:  Allergies[1]:   Her Current Medications Are:  Outpatient Encounter Medications as of 07/24/2024  Medication Sig   amLODipine  (NORVASC ) 5 MG tablet Take 5 mg by mouth daily.   atorvastatin  (LIPITOR) 20 MG tablet Take 20 mg by mouth daily.   doxepin (SINEQUAN) 25 MG capsule Take 25-75 mg by mouth at bedtime.   hydrochlorothiazide  (HYDRODIURIL ) 12.5 MG tablet Take 12.5 mg by mouth daily.   [DISCONTINUED] ondansetron  (ZOFRAN ) 4 MG tablet Take 4 mg by mouth every 8 (eight) hours as needed.   No facility-administered encounter medications on file as of 07/24/2024.  :   Review of Systems:  Out of a complete 14 point review of systems, all are reviewed and negative with the exception of these symptoms as listed below:   Review of Systems  Objective:  Neurological Exam  Physical Exam Physical Examination:   Vitals:   07/24/24 1506 07/24/24 1510  BP: (!) 146/90 (!) 145/86  Pulse: 71     General Examination: The patient is a very pleasant 54 y.o. female in no acute distress. She appears well-developed and well-nourished and well groomed.   HEENT: Normocephalic, atraumatic, pupils are equal, round and reactive to light, extraocular tracking is good without limitation to gaze excursion or nystagmus noted. No photophobia.  No Corrective eye glasses in place. Hearing is grossly intact.  Face is symmetric with normal facial animation. Speech is clear without dysarthria. There is no hypophonia. There is  no lip, neck/head, jaw or voice tremor. Neck is supple with full range of passive and active motion. There are no carotid bruits on auscultation.  Airway/Oropharynx exam reveals: mild mouth dryness, good dental hygiene and moderate airway crowding, due to Mallampati class III, tonsillar size 1-2+ bilaterally, thicker uvula.  Tongue protrudes centrally and palate elevates symmetrically, mild to moderate overbite noted.  Neck circumference 16 3/8 inches.  Chest: Clear to auscultation without wheezing, rhonchi or crackles noted.  Heart: S1+S2+0, regular and normal without murmurs, rubs or gallops noted.   Abdomen: Soft, non-tender and non-distended.  Extremities: There is no pitting edema in the distal lower extremities  bilaterally.   Skin: Warm and dry without trophic changes noted.   Musculoskeletal: exam reveals no obvious joint deformities.   Neurologically:  Mental status: The patient is awake, alert and oriented in all 4 spheres. Her immediate and remote memory, attention, language skills and fund of knowledge are appropriate. There is no evidence of aphasia, agnosia, apraxia or anomia. Speech is clear with normal prosody and enunciation. Thought process is linear. Mood is normal and affect is normal.  Cranial nerves II - XII are as described above under HEENT exam.  Motor exam: Normal bulk, moving all 4 extremities without any obvious restriction, no obvious action or resting tremor.  Fine motor skills and coordination: Intact grossly.  Cerebellar testing: No dysmetria or intention tremor. There is no truncal or gait ataxia.  Sensory exam: intact to light touch in the upper and lower extremities.  Gait, station and balance: She stands easily. No veering to one side is noted. No leaning to one side is noted. Posture is age-appropriate and stance is narrow based. Gait shows normal stride length and normal pace. No problems turning are noted.   Assessment and Plan:   In summary, [name] is a very pleasant 54 year old female with an underlying medical history of hypertension, history of vertigo, elevated lymphocyte count, status post carpal tunnel surgery, and severe obesity with a BMI of over 40, whose history and physical exam are concerning for sleep disordered breathing, particularly obstructive sleep apnea (OSA). A laboratory attended sleep study is typically considered gold standard for evaluation of sleep disordered breathing.   I had a long chat with the patient about my findings and the diagnosis of sleep apnea, particularly OSA, its prognosis and treatment options. We talked about medical/conservative treatments, surgical interventions and non-pharmacological approaches for symptom control. I explained, in particular, the risks and ramifications of untreated moderate to severe OSA, especially with respect to developing cardiovascular disease down the road, including congestive heart failure (CHF), difficult to treat hypertension, cardiac arrhythmias (particularly A-fib), neurovascular complications including TIA, stroke and dementia. Even type 2 diabetes has, in part, been linked to untreated OSA. Symptoms of untreated OSA may include (but may not be limited to) daytime sleepiness, nocturia (i.e. frequent nighttime urination), memory problems, mood irritability and suboptimally controlled or worsening mood disorder such as depression and/or anxiety, lack of energy, lack of motivation, physical discomfort, as well as recurrent headaches, especially morning or nocturnal headaches. We talked about the importance of maintaining a healthy lifestyle and striving for healthy weight. In addition, we talked about the importance of striving for and maintaining good sleep hygiene. I recommended a sleep study at this time. I outlined the differences between a laboratory attended sleep study which is considered more comprehensive and accurate over the option of a home sleep test (HST);  the latter may lead to underestimation of sleep disordered breathing in some instances and does not help with diagnosing upper airway resistance syndrome and is not accurate enough to diagnose primary central sleep apnea typically. I outlined possible surgical and non-surgical treatment options of OSA, including the use of a positive airway pressure (PAP) device (i.e. CPAP, AutoPAP/APAP or BiPAP in certain circumstances), a custom-made dental device (aka oral appliance, which would require a referral to a specialist dentist or orthodontist typically, and is generally speaking not considered for patients with full dentures or edentulous state), upper airway surgical options, such as traditional UPPP (which is not considered a first-line treatment) or the Inspire device (hypoglossal nerve stimulator,  which would involve a referral for consultation with an ENT surgeon, after careful selection, following inclusion criteria - also not first-line treatment). I explained the PAP treatment option to the patient in detail, as this is generally considered first-line treatment.  The patient indicated that she would be willing to try PAP therapy, if the need arises. I explained the importance of being compliant with PAP treatment, not only for insurance purposes but primarily to improve patient's symptoms symptoms, and for the patient's long term health benefit, including to reduce Her cardiovascular risks longer-term.    We will pick up our discussion about the next steps and treatment options after testing.  We will keep her posted as to the test results by phone call and/or MyChart messaging where possible.  We will plan to follow-up in sleep clinic accordingly as well.  I answered all her questions today and the patient was in agreement.   I encouraged her to call with any interim questions, concerns, problems or updates or email us  through MyChart.  Generally speaking, sleep test authorizations may take up to 2 weeks,  sometimes less, sometimes longer, the patient is encouraged to get in touch with us  if they do not hear back from the sleep lab staff directly within the next 2 weeks.  Thank you very much for allowing me to participate in the care of this nice patient. If I can be of any further assistance to you please do not hesitate to call me at [phone].  Sincerely,   True Mar, MD, PhD     [1] No Known Allergies

## 2024-07-24 NOTE — Patient Instructions (Signed)

## 2024-09-11 ENCOUNTER — Inpatient Hospital Stay: Attending: Medical Oncology

## 2024-09-18 ENCOUNTER — Inpatient Hospital Stay: Admitting: Oncology
